# Patient Record
Sex: Female | Born: 1945 | Race: White | Hispanic: No | Marital: Married | Smoking: Former smoker
Health system: Southern US, Community
[De-identification: ages and names within clinical notes are randomized; demographics above are authoritative.]

## PROBLEM LIST (undated history)

## (undated) DIAGNOSIS — F419 Anxiety disorder, unspecified: Secondary | ICD-10-CM

## (undated) DIAGNOSIS — R159 Full incontinence of feces: Secondary | ICD-10-CM

## (undated) DIAGNOSIS — E785 Hyperlipidemia, unspecified: Secondary | ICD-10-CM

## (undated) DIAGNOSIS — C4490 Unspecified malignant neoplasm of skin, unspecified: Secondary | ICD-10-CM

## (undated) DIAGNOSIS — J45909 Unspecified asthma, uncomplicated: Secondary | ICD-10-CM

## (undated) DIAGNOSIS — M549 Dorsalgia, unspecified: Secondary | ICD-10-CM

## (undated) DIAGNOSIS — H269 Unspecified cataract: Secondary | ICD-10-CM

## (undated) DIAGNOSIS — K5790 Diverticulosis of intestine, part unspecified, without perforation or abscess without bleeding: Secondary | ICD-10-CM

## (undated) DIAGNOSIS — R143 Flatulence: Secondary | ICD-10-CM

## (undated) DIAGNOSIS — Z860100 Personal history of colon polyps, unspecified: Secondary | ICD-10-CM

## (undated) DIAGNOSIS — I10 Essential (primary) hypertension: Secondary | ICD-10-CM

## (undated) DIAGNOSIS — K589 Irritable bowel syndrome without diarrhea: Secondary | ICD-10-CM

## (undated) DIAGNOSIS — M1990 Unspecified osteoarthritis, unspecified site: Secondary | ICD-10-CM

## (undated) DIAGNOSIS — J302 Other seasonal allergic rhinitis: Secondary | ICD-10-CM

## (undated) DIAGNOSIS — K219 Gastro-esophageal reflux disease without esophagitis: Secondary | ICD-10-CM

## (undated) HISTORY — PX: KNEE SURGERY: SHX244

## (undated) HISTORY — DX: Hyperlipidemia, unspecified: E78.5

## (undated) HISTORY — DX: Flatulence: R14.3

## (undated) HISTORY — DX: Diverticulosis of intestine, part unspecified, without perforation or abscess without bleeding: K57.90

## (undated) HISTORY — DX: Unspecified cataract: H26.9

## (undated) HISTORY — PX: FOOT SURGERY: SHX648

## (undated) HISTORY — PX: CATARACT EXTRACTION, BILATERAL: SHX1313

## (undated) HISTORY — DX: Gastro-esophageal reflux disease without esophagitis: K21.9

## (undated) HISTORY — DX: Full incontinence of feces: R15.9

## (undated) HISTORY — DX: Dorsalgia, unspecified: M54.9

## (undated) HISTORY — DX: Irritable bowel syndrome, unspecified: K58.9

## (undated) HISTORY — DX: Unspecified malignant neoplasm of skin, unspecified: C44.90

## (undated) HISTORY — DX: Personal history of colon polyps, unspecified: Z86.0100

## (undated) HISTORY — DX: Other seasonal allergic rhinitis: J30.2

## (undated) HISTORY — DX: Anxiety disorder, unspecified: F41.9

## (undated) HISTORY — DX: Essential (primary) hypertension: I10

## (undated) HISTORY — PX: BACK SURGERY: SHX140

## (undated) HISTORY — DX: Personal history of colonic polyps: Z86.010

## (undated) HISTORY — DX: Unspecified osteoarthritis, unspecified site: M19.90

## (undated) HISTORY — DX: Unspecified asthma, uncomplicated: J45.909

## (undated) HISTORY — PX: APPENDECTOMY: SHX54

## (undated) HISTORY — PX: ABDOMINAL HYSTERECTOMY: SHX81

---

## 2008-05-10 DIAGNOSIS — C4492 Squamous cell carcinoma of skin, unspecified: Secondary | ICD-10-CM

## 2008-05-10 HISTORY — DX: Squamous cell carcinoma of skin, unspecified: C44.92

## 2009-08-19 ENCOUNTER — Ambulatory Visit (HOSPITAL_COMMUNITY): Admission: RE | Admit: 2009-08-19 | Discharge: 2009-08-20 | Payer: Self-pay | Admitting: Neurosurgery

## 2010-06-06 LAB — SURGICAL PCR SCREEN
MRSA, PCR: NEGATIVE
Staphylococcus aureus: NEGATIVE

## 2010-06-06 LAB — BASIC METABOLIC PANEL
Creatinine, Ser: 1.1 mg/dL (ref 0.4–1.2)
GFR calc Af Amer: >60 mL/min (ref >60)
GFR calc non Af Amer: 50 mL/min — ABNORMAL LOW (ref >60)
Potassium: 3.8 mEq/L (ref 3.5–5.1)
Sodium: 140 mEq/L (ref 135–145)

## 2010-06-06 LAB — CBC
MCHC: 34.3 g/dL (ref 30.0–36.0)
Platelets: 325 10*3/uL (ref 150–400)
RBC: 4.97 MIL/uL (ref 3.87–5.11)
WBC: 6.1 10*3/uL (ref 4.0–10.5)

## 2010-06-06 LAB — DIFFERENTIAL
Basophils Absolute: 0 10*3/uL (ref 0.0–0.1)
Basophils Relative: 1 % (ref 0–1)
Eosinophils Absolute: 0 10*3/uL (ref 0.0–0.7)
Eosinophils Relative: 1 % (ref 0–5)
Lymphocytes Relative: 32 % (ref 12–46)
Monocytes Absolute: 0.5 10*3/uL (ref 0.1–1.0)
Monocytes Relative: 9 % (ref 3–12)
Neutro Abs: 3.6 10*3/uL (ref 1.7–7.7)

## 2013-03-26 DIAGNOSIS — I10 Essential (primary) hypertension: Secondary | ICD-10-CM | POA: Diagnosis not present

## 2013-03-26 DIAGNOSIS — Z1211 Encounter for screening for malignant neoplasm of colon: Secondary | ICD-10-CM | POA: Diagnosis not present

## 2013-04-16 DIAGNOSIS — I119 Hypertensive heart disease without heart failure: Secondary | ICD-10-CM | POA: Diagnosis not present

## 2013-04-16 DIAGNOSIS — D126 Benign neoplasm of colon, unspecified: Secondary | ICD-10-CM | POA: Diagnosis not present

## 2013-04-16 DIAGNOSIS — K5730 Diverticulosis of large intestine without perforation or abscess without bleeding: Secondary | ICD-10-CM | POA: Diagnosis not present

## 2013-04-16 DIAGNOSIS — Z1211 Encounter for screening for malignant neoplasm of colon: Secondary | ICD-10-CM | POA: Diagnosis not present

## 2013-06-16 DIAGNOSIS — H526 Other disorders of refraction: Secondary | ICD-10-CM | POA: Diagnosis not present

## 2013-06-16 DIAGNOSIS — H2510 Age-related nuclear cataract, unspecified eye: Secondary | ICD-10-CM | POA: Diagnosis not present

## 2013-06-16 DIAGNOSIS — H11159 Pinguecula, unspecified eye: Secondary | ICD-10-CM | POA: Diagnosis not present

## 2013-06-16 DIAGNOSIS — H04129 Dry eye syndrome of unspecified lacrimal gland: Secondary | ICD-10-CM | POA: Diagnosis not present

## 2013-06-23 DIAGNOSIS — E785 Hyperlipidemia, unspecified: Secondary | ICD-10-CM | POA: Diagnosis not present

## 2013-06-23 DIAGNOSIS — R7301 Impaired fasting glucose: Secondary | ICD-10-CM | POA: Diagnosis not present

## 2013-06-30 DIAGNOSIS — N182 Chronic kidney disease, stage 2 (mild): Secondary | ICD-10-CM | POA: Diagnosis not present

## 2013-06-30 DIAGNOSIS — Z23 Encounter for immunization: Secondary | ICD-10-CM | POA: Diagnosis not present

## 2013-06-30 DIAGNOSIS — Z0000 Encounter for general adult medical examination without abnormal findings: Secondary | ICD-10-CM | POA: Diagnosis not present

## 2013-07-03 DIAGNOSIS — Z1231 Encounter for screening mammogram for malignant neoplasm of breast: Secondary | ICD-10-CM | POA: Diagnosis not present

## 2013-07-07 DIAGNOSIS — L570 Actinic keratosis: Secondary | ICD-10-CM | POA: Diagnosis not present

## 2013-07-07 DIAGNOSIS — D235 Other benign neoplasm of skin of trunk: Secondary | ICD-10-CM | POA: Diagnosis not present

## 2013-07-07 DIAGNOSIS — L819 Disorder of pigmentation, unspecified: Secondary | ICD-10-CM | POA: Diagnosis not present

## 2013-07-07 DIAGNOSIS — L820 Inflamed seborrheic keratosis: Secondary | ICD-10-CM | POA: Diagnosis not present

## 2013-11-03 ENCOUNTER — Ambulatory Visit (INDEPENDENT_AMBULATORY_CARE_PROVIDER_SITE_OTHER): Payer: Medicare Other

## 2013-11-03 ENCOUNTER — Encounter: Payer: Self-pay | Admitting: Podiatry

## 2013-11-03 ENCOUNTER — Ambulatory Visit (INDEPENDENT_AMBULATORY_CARE_PROVIDER_SITE_OTHER): Payer: Medicare Other | Admitting: Podiatry

## 2013-11-03 VITALS — BP 126/77 | HR 63 | Resp 16 | Ht 62.0 in | Wt 131.0 lb

## 2013-11-03 DIAGNOSIS — M779 Enthesopathy, unspecified: Secondary | ICD-10-CM | POA: Diagnosis not present

## 2013-11-03 DIAGNOSIS — M7750 Other enthesopathy of unspecified foot: Secondary | ICD-10-CM

## 2013-11-03 DIAGNOSIS — M7672 Peroneal tendinitis, left leg: Secondary | ICD-10-CM

## 2013-11-03 NOTE — Progress Notes (Signed)
   Subjective:    Patient ID: Ashley Brennan, female    DOB: 04-18-1945, 68 y.o.   MRN: [id number]  HPI Comments: My left foot on the lateral side hurts. Its been hurting for 2 months. Its about the same, sometimes the pain is worse. Exercising bothers it. i cant walk bare foot. The bed sheets hurt it. i see a podiatrist and he gives me injections. i had my last injection 2 yrs ago. i wear good shoes, stopped my exercising, ice.  Foot Pain      Review of Systems  Eyes: Positive for itching.  Musculoskeletal:       Joint pain Difficulty walking   Allergic/Immunologic: Positive for environmental allergies.  All other systems reviewed and are negative.      Objective:   Physical Exam: I have reviewed her past medical history medications allergies surgeries social history and review of systems. Pulses are strongly palpable bilateral. Neurologic sensorium is intact per since once the monofilament. Deep tendon reflexes are intact bilateral muscle strength is 5 over 5 dorsiflexors plantar flexors inverters everters all intrinsic musculature is intact. She has pain on palpation fifth metatarsal base of the left foot with no pain on palpation of the peroneal tendons.  Assessment: Insertional peroneal tendinitis left foot.  Plan: Injected dexamethasone and Kenalog today with local anesthetic fifth metatarsal base left.        Assessment & Plan:

## 2014-01-20 DIAGNOSIS — Z23 Encounter for immunization: Secondary | ICD-10-CM | POA: Diagnosis not present

## 2014-02-02 DIAGNOSIS — H04123 Dry eye syndrome of bilateral lacrimal glands: Secondary | ICD-10-CM | POA: Diagnosis not present

## 2014-10-19 DIAGNOSIS — M180 Bilateral primary osteoarthritis of first carpometacarpal joints: Secondary | ICD-10-CM | POA: Diagnosis not present

## 2014-11-05 ENCOUNTER — Encounter: Payer: Self-pay | Admitting: Podiatry

## 2014-11-05 ENCOUNTER — Ambulatory Visit (INDEPENDENT_AMBULATORY_CARE_PROVIDER_SITE_OTHER): Payer: Medicare Other | Admitting: Podiatry

## 2014-11-05 ENCOUNTER — Ambulatory Visit (INDEPENDENT_AMBULATORY_CARE_PROVIDER_SITE_OTHER): Payer: Medicare Other

## 2014-11-05 DIAGNOSIS — M205X1 Other deformities of toe(s) (acquired), right foot: Secondary | ICD-10-CM

## 2014-11-05 DIAGNOSIS — M778 Other enthesopathies, not elsewhere classified: Secondary | ICD-10-CM

## 2014-11-05 DIAGNOSIS — M7751 Other enthesopathy of right foot: Secondary | ICD-10-CM | POA: Diagnosis not present

## 2014-11-05 DIAGNOSIS — M779 Enthesopathy, unspecified: Secondary | ICD-10-CM | POA: Diagnosis not present

## 2014-11-05 MED ORDER — DICLOFENAC SODIUM 1 % TD GEL: 4.0000 g | Freq: Four times a day (QID) | TRANSDERMAL | Status: DC

## 2014-11-05 NOTE — Progress Notes (Signed)
   Subjective:    Patient ID: Ashley Brennan, female    DOB: 07/20/1945, 69 y.o.   MRN: [id number]  HPI : She presents today with chief complaint of a painful first metatarsophalangeal joint of the right foot. She states this been bothering her for quite some time. She states that I'm sure that his arthritis just like in my hands in my hands injected multiple times.    Review of Systems  Musculoskeletal: Positive for gait problem.       Objective:   Physical Exam : I have reviewed her past medical history medications allergies surgery social history and surgical history. I reviewed her chief complaint and statement. She is in no acute distress and quite jovial.  Vital signs are stable she is alert and oriented 3. Pulses are strongly palpable bilateral. Neurologic sensorium is intact with Derrel Nip monofilament. Deep tendon reflexes are intact bilateral. Muscle strength +5 over 5 dorsiflexion plantarflexion  Inverters and evertors all intrinsic musculature is intact. Orthopedic evaluation all joints distal to the ankle have full range of motion without crepitation.  She has pain on end range of motion of the first metatarsophalangeal joint. She has pain on distraction of the first metatarsophalangeal joint and dorsal spurring is noted.  Radiographs taken today 3 views right foot demonstrate normal osseous architecture with exception of some osteoarthritic changes first metatarsophalangeal joint of the right foot assistants consistent with joint space narrowing subchondral sclerosis. Cutaneous evaluationsupple well-hydrated acute is no erythema and edema stabilized drainage.          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  Plan: discussed etiology pathology conservative versus surgical therapies. At this point I offered her a prescription for diclofenac gel to be applied 2-4 times a day. I also injected the first  metatarsophalangeal joint with dexamethasone and local and aesthetic today after a sterile Betadine skin prep. We discussed appropriate shoe gear stretching exercises ice therapy and she'll modifications. I will follow-up with her in the near future.    Roselind Messier DPM

## 2014-11-17 ENCOUNTER — Other Ambulatory Visit: Payer: Self-pay | Admitting: Family Medicine

## 2014-11-17 DIAGNOSIS — Z0000 Encounter for general adult medical examination without abnormal findings: Secondary | ICD-10-CM | POA: Diagnosis not present

## 2014-11-17 DIAGNOSIS — E785 Hyperlipidemia, unspecified: Secondary | ICD-10-CM | POA: Diagnosis not present

## 2014-11-17 DIAGNOSIS — Z1239 Encounter for other screening for malignant neoplasm of breast: Secondary | ICD-10-CM | POA: Diagnosis not present

## 2014-11-17 DIAGNOSIS — I10 Essential (primary) hypertension: Secondary | ICD-10-CM | POA: Diagnosis not present

## 2014-11-17 DIAGNOSIS — R1011 Right upper quadrant pain: Secondary | ICD-10-CM | POA: Diagnosis not present

## 2014-11-17 DIAGNOSIS — R7301 Impaired fasting glucose: Secondary | ICD-10-CM | POA: Diagnosis not present

## 2014-11-25 ENCOUNTER — Ambulatory Visit
Admission: RE | Admit: 2014-11-25 | Discharge: 2014-11-25 | Disposition: A | Discharge status: Home / Self Care | Payer: Medicare Other | Source: Ambulatory Visit | Attending: Family Medicine | Admitting: Family Medicine

## 2014-11-25 DIAGNOSIS — R1011 Right upper quadrant pain: Secondary | ICD-10-CM | POA: Diagnosis not present

## 2014-11-26 ENCOUNTER — Other Ambulatory Visit: Payer: Self-pay | Admitting: Family Medicine

## 2014-11-26 DIAGNOSIS — Z1231 Encounter for screening mammogram for malignant neoplasm of breast: Secondary | ICD-10-CM

## 2014-12-08 ENCOUNTER — Ambulatory Visit
Admission: RE | Admit: 2014-12-08 | Discharge: 2014-12-08 | Disposition: A | Discharge status: Home / Self Care | Payer: Medicare Other | Source: Ambulatory Visit | Attending: Family Medicine | Admitting: Family Medicine

## 2014-12-08 DIAGNOSIS — Z1231 Encounter for screening mammogram for malignant neoplasm of breast: Secondary | ICD-10-CM

## 2014-12-22 DIAGNOSIS — L814 Other melanin hyperpigmentation: Secondary | ICD-10-CM | POA: Diagnosis not present

## 2014-12-22 DIAGNOSIS — D485 Neoplasm of uncertain behavior of skin: Secondary | ICD-10-CM | POA: Diagnosis not present

## 2014-12-22 DIAGNOSIS — D239 Other benign neoplasm of skin, unspecified: Secondary | ICD-10-CM | POA: Diagnosis not present

## 2014-12-22 DIAGNOSIS — L570 Actinic keratosis: Secondary | ICD-10-CM | POA: Diagnosis not present

## 2014-12-30 DIAGNOSIS — Z23 Encounter for immunization: Secondary | ICD-10-CM | POA: Diagnosis not present

## 2015-01-14 DIAGNOSIS — M5416 Radiculopathy, lumbar region: Secondary | ICD-10-CM | POA: Diagnosis not present

## 2015-01-14 DIAGNOSIS — Z6825 Body mass index (BMI) 25.0-25.9, adult: Secondary | ICD-10-CM | POA: Diagnosis not present

## 2015-01-14 DIAGNOSIS — I10 Essential (primary) hypertension: Secondary | ICD-10-CM | POA: Diagnosis not present

## 2015-01-14 DIAGNOSIS — M5412 Radiculopathy, cervical region: Secondary | ICD-10-CM | POA: Insufficient documentation

## 2015-01-15 DIAGNOSIS — M5416 Radiculopathy, lumbar region: Secondary | ICD-10-CM | POA: Diagnosis not present

## 2015-01-20 DIAGNOSIS — M5412 Radiculopathy, cervical region: Secondary | ICD-10-CM | POA: Diagnosis not present

## 2015-01-20 DIAGNOSIS — M47816 Spondylosis without myelopathy or radiculopathy, lumbar region: Secondary | ICD-10-CM | POA: Diagnosis not present

## 2015-01-20 DIAGNOSIS — M5416 Radiculopathy, lumbar region: Secondary | ICD-10-CM | POA: Diagnosis not present

## 2015-01-20 DIAGNOSIS — M542 Cervicalgia: Secondary | ICD-10-CM | POA: Diagnosis not present

## 2015-01-28 DIAGNOSIS — M5416 Radiculopathy, lumbar region: Secondary | ICD-10-CM | POA: Diagnosis not present

## 2015-03-01 DIAGNOSIS — H04123 Dry eye syndrome of bilateral lacrimal glands: Secondary | ICD-10-CM | POA: Diagnosis not present

## 2015-03-01 DIAGNOSIS — H25813 Combined forms of age-related cataract, bilateral: Secondary | ICD-10-CM | POA: Diagnosis not present

## 2015-04-08 DIAGNOSIS — I10 Essential (primary) hypertension: Secondary | ICD-10-CM | POA: Diagnosis not present

## 2015-04-12 DIAGNOSIS — M5432 Sciatica, left side: Secondary | ICD-10-CM | POA: Diagnosis not present

## 2015-04-12 DIAGNOSIS — M5410 Radiculopathy, site unspecified: Secondary | ICD-10-CM | POA: Diagnosis not present

## 2015-04-12 DIAGNOSIS — M25552 Pain in left hip: Secondary | ICD-10-CM | POA: Diagnosis not present

## 2015-04-13 DIAGNOSIS — M25552 Pain in left hip: Secondary | ICD-10-CM | POA: Diagnosis not present

## 2015-04-13 DIAGNOSIS — M5410 Radiculopathy, site unspecified: Secondary | ICD-10-CM | POA: Diagnosis not present

## 2015-04-13 DIAGNOSIS — M5432 Sciatica, left side: Secondary | ICD-10-CM | POA: Diagnosis not present

## 2015-04-14 DIAGNOSIS — M25552 Pain in left hip: Secondary | ICD-10-CM | POA: Diagnosis not present

## 2015-04-14 DIAGNOSIS — E785 Hyperlipidemia, unspecified: Secondary | ICD-10-CM | POA: Diagnosis not present

## 2015-04-14 DIAGNOSIS — I10 Essential (primary) hypertension: Secondary | ICD-10-CM | POA: Diagnosis not present

## 2015-04-20 DIAGNOSIS — M25552 Pain in left hip: Secondary | ICD-10-CM | POA: Diagnosis not present

## 2015-04-20 DIAGNOSIS — H25813 Combined forms of age-related cataract, bilateral: Secondary | ICD-10-CM | POA: Diagnosis not present

## 2015-04-20 DIAGNOSIS — Z01818 Encounter for other preprocedural examination: Secondary | ICD-10-CM | POA: Diagnosis not present

## 2015-04-20 DIAGNOSIS — M5432 Sciatica, left side: Secondary | ICD-10-CM | POA: Diagnosis not present

## 2015-04-20 DIAGNOSIS — M5410 Radiculopathy, site unspecified: Secondary | ICD-10-CM | POA: Diagnosis not present

## 2015-04-22 DIAGNOSIS — I10 Essential (primary) hypertension: Secondary | ICD-10-CM | POA: Diagnosis not present

## 2015-04-22 DIAGNOSIS — M25552 Pain in left hip: Secondary | ICD-10-CM | POA: Diagnosis not present

## 2015-04-22 DIAGNOSIS — M5410 Radiculopathy, site unspecified: Secondary | ICD-10-CM | POA: Diagnosis not present

## 2015-04-22 DIAGNOSIS — M5432 Sciatica, left side: Secondary | ICD-10-CM | POA: Diagnosis not present

## 2015-04-22 DIAGNOSIS — E785 Hyperlipidemia, unspecified: Secondary | ICD-10-CM | POA: Diagnosis not present

## 2015-04-23 DIAGNOSIS — I10 Essential (primary) hypertension: Secondary | ICD-10-CM | POA: Diagnosis not present

## 2015-04-23 DIAGNOSIS — M25552 Pain in left hip: Secondary | ICD-10-CM | POA: Diagnosis not present

## 2015-04-26 DIAGNOSIS — H25811 Combined forms of age-related cataract, right eye: Secondary | ICD-10-CM | POA: Diagnosis not present

## 2015-05-03 DIAGNOSIS — M5410 Radiculopathy, site unspecified: Secondary | ICD-10-CM | POA: Diagnosis not present

## 2015-05-03 DIAGNOSIS — M5432 Sciatica, left side: Secondary | ICD-10-CM | POA: Diagnosis not present

## 2015-05-03 DIAGNOSIS — M25552 Pain in left hip: Secondary | ICD-10-CM | POA: Diagnosis not present

## 2015-05-04 DIAGNOSIS — H25811 Combined forms of age-related cataract, right eye: Secondary | ICD-10-CM | POA: Diagnosis not present

## 2015-05-05 DIAGNOSIS — Z961 Presence of intraocular lens: Secondary | ICD-10-CM | POA: Insufficient documentation

## 2015-05-06 DIAGNOSIS — M25552 Pain in left hip: Secondary | ICD-10-CM | POA: Diagnosis not present

## 2015-05-06 DIAGNOSIS — M5432 Sciatica, left side: Secondary | ICD-10-CM | POA: Diagnosis not present

## 2015-05-06 DIAGNOSIS — M5410 Radiculopathy, site unspecified: Secondary | ICD-10-CM | POA: Diagnosis not present

## 2015-05-10 DIAGNOSIS — M25552 Pain in left hip: Secondary | ICD-10-CM | POA: Diagnosis not present

## 2015-05-10 DIAGNOSIS — M5410 Radiculopathy, site unspecified: Secondary | ICD-10-CM | POA: Diagnosis not present

## 2015-05-10 DIAGNOSIS — M5432 Sciatica, left side: Secondary | ICD-10-CM | POA: Diagnosis not present

## 2015-05-14 DIAGNOSIS — M5432 Sciatica, left side: Secondary | ICD-10-CM | POA: Diagnosis not present

## 2015-05-14 DIAGNOSIS — M25552 Pain in left hip: Secondary | ICD-10-CM | POA: Diagnosis not present

## 2015-05-14 DIAGNOSIS — M5410 Radiculopathy, site unspecified: Secondary | ICD-10-CM | POA: Diagnosis not present

## 2015-05-17 DIAGNOSIS — M5432 Sciatica, left side: Secondary | ICD-10-CM | POA: Diagnosis not present

## 2015-05-17 DIAGNOSIS — M5410 Radiculopathy, site unspecified: Secondary | ICD-10-CM | POA: Diagnosis not present

## 2015-05-17 DIAGNOSIS — M25552 Pain in left hip: Secondary | ICD-10-CM | POA: Diagnosis not present

## 2015-05-18 DIAGNOSIS — H25812 Combined forms of age-related cataract, left eye: Secondary | ICD-10-CM | POA: Diagnosis not present

## 2015-05-20 DIAGNOSIS — M5410 Radiculopathy, site unspecified: Secondary | ICD-10-CM | POA: Diagnosis not present

## 2015-05-20 DIAGNOSIS — M25552 Pain in left hip: Secondary | ICD-10-CM | POA: Diagnosis not present

## 2015-05-20 DIAGNOSIS — M5432 Sciatica, left side: Secondary | ICD-10-CM | POA: Diagnosis not present

## 2015-05-24 DIAGNOSIS — Z961 Presence of intraocular lens: Secondary | ICD-10-CM | POA: Diagnosis not present

## 2015-06-15 DIAGNOSIS — J019 Acute sinusitis, unspecified: Secondary | ICD-10-CM | POA: Diagnosis not present

## 2015-07-05 DIAGNOSIS — D485 Neoplasm of uncertain behavior of skin: Secondary | ICD-10-CM | POA: Diagnosis not present

## 2015-07-05 DIAGNOSIS — L820 Inflamed seborrheic keratosis: Secondary | ICD-10-CM | POA: Diagnosis not present

## 2015-11-24 DIAGNOSIS — E785 Hyperlipidemia, unspecified: Secondary | ICD-10-CM | POA: Diagnosis not present

## 2015-11-24 DIAGNOSIS — Z79899 Other long term (current) drug therapy: Secondary | ICD-10-CM | POA: Diagnosis not present

## 2015-11-24 DIAGNOSIS — I10 Essential (primary) hypertension: Secondary | ICD-10-CM | POA: Diagnosis not present

## 2015-11-26 DIAGNOSIS — E785 Hyperlipidemia, unspecified: Secondary | ICD-10-CM | POA: Diagnosis not present

## 2015-11-26 DIAGNOSIS — I10 Essential (primary) hypertension: Secondary | ICD-10-CM | POA: Diagnosis not present

## 2015-11-26 DIAGNOSIS — Z0000 Encounter for general adult medical examination without abnormal findings: Secondary | ICD-10-CM | POA: Diagnosis not present

## 2015-11-26 DIAGNOSIS — Z23 Encounter for immunization: Secondary | ICD-10-CM | POA: Diagnosis not present

## 2016-02-14 ENCOUNTER — Other Ambulatory Visit: Payer: Self-pay | Admitting: Family

## 2016-02-14 DIAGNOSIS — E2839 Other primary ovarian failure: Secondary | ICD-10-CM

## 2016-02-14 DIAGNOSIS — Z1231 Encounter for screening mammogram for malignant neoplasm of breast: Secondary | ICD-10-CM

## 2016-03-07 ENCOUNTER — Other Ambulatory Visit: Payer: Self-pay | Admitting: Physician Assistant

## 2016-03-07 DIAGNOSIS — M1711 Unilateral primary osteoarthritis, right knee: Secondary | ICD-10-CM | POA: Diagnosis not present

## 2016-03-07 DIAGNOSIS — M25511 Pain in right shoulder: Secondary | ICD-10-CM | POA: Diagnosis not present

## 2016-03-08 ENCOUNTER — Other Ambulatory Visit: Payer: Self-pay | Admitting: Physician Assistant

## 2016-03-08 DIAGNOSIS — M25511 Pain in right shoulder: Secondary | ICD-10-CM

## 2016-03-16 ENCOUNTER — Ambulatory Visit
Admission: RE | Admit: 2016-03-16 | Discharge: 2016-03-16 | Disposition: A | Discharge status: Home / Self Care | Payer: Medicare Other | Source: Ambulatory Visit | Attending: Physician Assistant | Admitting: Physician Assistant

## 2016-03-16 ENCOUNTER — Other Ambulatory Visit: Payer: Self-pay | Admitting: Physician Assistant

## 2016-03-16 DIAGNOSIS — R072 Precordial pain: Secondary | ICD-10-CM | POA: Diagnosis not present

## 2016-03-16 DIAGNOSIS — M25511 Pain in right shoulder: Secondary | ICD-10-CM

## 2016-03-24 ENCOUNTER — Ambulatory Visit: Payer: Medicare Other

## 2016-03-24 ENCOUNTER — Inpatient Hospital Stay: Admission: RE | Admit: 2016-03-24 | Payer: Medicare Other | Source: Ambulatory Visit

## 2016-03-29 DIAGNOSIS — M19011 Primary osteoarthritis, right shoulder: Secondary | ICD-10-CM | POA: Diagnosis not present

## 2016-05-10 DIAGNOSIS — D229 Melanocytic nevi, unspecified: Secondary | ICD-10-CM | POA: Diagnosis not present

## 2016-05-10 DIAGNOSIS — D044 Carcinoma in situ of skin of scalp and neck: Secondary | ICD-10-CM | POA: Diagnosis not present

## 2016-05-10 DIAGNOSIS — Z7189 Other specified counseling: Secondary | ICD-10-CM | POA: Diagnosis not present

## 2016-05-15 DIAGNOSIS — H16223 Keratoconjunctivitis sicca, not specified as Sjogren's, bilateral: Secondary | ICD-10-CM | POA: Diagnosis not present

## 2016-05-15 DIAGNOSIS — H35371 Puckering of macula, right eye: Secondary | ICD-10-CM | POA: Diagnosis not present

## 2016-05-15 DIAGNOSIS — Z961 Presence of intraocular lens: Secondary | ICD-10-CM | POA: Diagnosis not present

## 2016-05-15 DIAGNOSIS — H26493 Other secondary cataract, bilateral: Secondary | ICD-10-CM | POA: Diagnosis not present

## 2016-06-22 DIAGNOSIS — L570 Actinic keratosis: Secondary | ICD-10-CM | POA: Diagnosis not present

## 2016-06-22 DIAGNOSIS — D044 Carcinoma in situ of skin of scalp and neck: Secondary | ICD-10-CM | POA: Diagnosis not present

## 2016-07-07 ENCOUNTER — Ambulatory Visit
Admission: RE | Admit: 2016-07-07 | Discharge: 2016-07-07 | Disposition: A | Discharge status: Home / Self Care | Payer: Medicare Other | Source: Ambulatory Visit | Attending: Family | Admitting: Family

## 2016-07-07 ENCOUNTER — Encounter: Payer: Self-pay | Admitting: Radiology

## 2016-07-07 DIAGNOSIS — Z1231 Encounter for screening mammogram for malignant neoplasm of breast: Secondary | ICD-10-CM | POA: Diagnosis not present

## 2016-08-09 DIAGNOSIS — M5136 Other intervertebral disc degeneration, lumbar region: Secondary | ICD-10-CM | POA: Diagnosis not present

## 2016-08-09 DIAGNOSIS — M5030 Other cervical disc degeneration, unspecified cervical region: Secondary | ICD-10-CM | POA: Diagnosis not present

## 2016-08-09 DIAGNOSIS — M542 Cervicalgia: Secondary | ICD-10-CM | POA: Diagnosis not present

## 2016-08-09 DIAGNOSIS — M9903 Segmental and somatic dysfunction of lumbar region: Secondary | ICD-10-CM | POA: Diagnosis not present

## 2016-08-11 DIAGNOSIS — M5030 Other cervical disc degeneration, unspecified cervical region: Secondary | ICD-10-CM | POA: Diagnosis not present

## 2016-08-11 DIAGNOSIS — M5136 Other intervertebral disc degeneration, lumbar region: Secondary | ICD-10-CM | POA: Diagnosis not present

## 2016-08-11 DIAGNOSIS — M542 Cervicalgia: Secondary | ICD-10-CM | POA: Diagnosis not present

## 2016-08-11 DIAGNOSIS — M9903 Segmental and somatic dysfunction of lumbar region: Secondary | ICD-10-CM | POA: Diagnosis not present

## 2016-08-16 DIAGNOSIS — M5136 Other intervertebral disc degeneration, lumbar region: Secondary | ICD-10-CM | POA: Diagnosis not present

## 2016-08-16 DIAGNOSIS — M9903 Segmental and somatic dysfunction of lumbar region: Secondary | ICD-10-CM | POA: Diagnosis not present

## 2016-08-16 DIAGNOSIS — M5030 Other cervical disc degeneration, unspecified cervical region: Secondary | ICD-10-CM | POA: Diagnosis not present

## 2016-08-16 DIAGNOSIS — M542 Cervicalgia: Secondary | ICD-10-CM | POA: Diagnosis not present

## 2016-10-24 DIAGNOSIS — L570 Actinic keratosis: Secondary | ICD-10-CM | POA: Diagnosis not present

## 2016-10-24 DIAGNOSIS — C4492 Squamous cell carcinoma of skin, unspecified: Secondary | ICD-10-CM

## 2016-10-24 DIAGNOSIS — D0461 Carcinoma in situ of skin of right upper limb, including shoulder: Secondary | ICD-10-CM | POA: Diagnosis not present

## 2016-10-24 HISTORY — DX: Squamous cell carcinoma of skin, unspecified: C44.92

## 2016-10-31 DIAGNOSIS — Z1211 Encounter for screening for malignant neoplasm of colon: Secondary | ICD-10-CM | POA: Diagnosis not present

## 2016-10-31 DIAGNOSIS — Z86010 Personal history of colonic polyps: Secondary | ICD-10-CM | POA: Diagnosis not present

## 2016-10-31 DIAGNOSIS — K580 Irritable bowel syndrome with diarrhea: Secondary | ICD-10-CM | POA: Diagnosis not present

## 2016-11-01 DIAGNOSIS — A09 Infectious gastroenteritis and colitis, unspecified: Secondary | ICD-10-CM | POA: Diagnosis not present

## 2016-11-02 DIAGNOSIS — K580 Irritable bowel syndrome with diarrhea: Secondary | ICD-10-CM | POA: Diagnosis not present

## 2016-11-02 DIAGNOSIS — E785 Hyperlipidemia, unspecified: Secondary | ICD-10-CM | POA: Diagnosis not present

## 2016-11-02 DIAGNOSIS — I10 Essential (primary) hypertension: Secondary | ICD-10-CM | POA: Diagnosis not present

## 2016-11-03 DIAGNOSIS — K648 Other hemorrhoids: Secondary | ICD-10-CM | POA: Diagnosis not present

## 2016-11-03 DIAGNOSIS — K52832 Lymphocytic colitis: Secondary | ICD-10-CM | POA: Diagnosis not present

## 2016-11-03 DIAGNOSIS — R197 Diarrhea, unspecified: Secondary | ICD-10-CM | POA: Diagnosis not present

## 2016-11-03 DIAGNOSIS — Z1211 Encounter for screening for malignant neoplasm of colon: Secondary | ICD-10-CM | POA: Diagnosis not present

## 2016-11-03 DIAGNOSIS — Z86010 Personal history of colonic polyps: Secondary | ICD-10-CM | POA: Diagnosis not present

## 2016-11-03 DIAGNOSIS — K6389 Other specified diseases of intestine: Secondary | ICD-10-CM | POA: Diagnosis not present

## 2016-11-03 DIAGNOSIS — K5730 Diverticulosis of large intestine without perforation or abscess without bleeding: Secondary | ICD-10-CM | POA: Diagnosis not present

## 2016-11-03 DIAGNOSIS — Z8719 Personal history of other diseases of the digestive system: Secondary | ICD-10-CM | POA: Diagnosis not present

## 2016-11-03 HISTORY — PX: COLONOSCOPY: SHX174

## 2016-11-24 DIAGNOSIS — H04123 Dry eye syndrome of bilateral lacrimal glands: Secondary | ICD-10-CM | POA: Diagnosis not present

## 2016-11-24 DIAGNOSIS — H43811 Vitreous degeneration, right eye: Secondary | ICD-10-CM | POA: Diagnosis not present

## 2016-11-24 DIAGNOSIS — H35371 Puckering of macula, right eye: Secondary | ICD-10-CM | POA: Diagnosis not present

## 2016-11-28 ENCOUNTER — Other Ambulatory Visit: Payer: Self-pay | Admitting: Gastroenterology

## 2016-11-28 DIAGNOSIS — K52832 Lymphocytic colitis: Secondary | ICD-10-CM

## 2016-11-28 DIAGNOSIS — K580 Irritable bowel syndrome with diarrhea: Secondary | ICD-10-CM

## 2016-12-01 ENCOUNTER — Ambulatory Visit
Admission: RE | Admit: 2016-12-01 | Discharge: 2016-12-01 | Disposition: A | Discharge status: Home / Self Care | Payer: Medicare Other | Source: Ambulatory Visit | Attending: Gastroenterology | Admitting: Gastroenterology

## 2016-12-01 DIAGNOSIS — K52832 Lymphocytic colitis: Secondary | ICD-10-CM

## 2016-12-01 DIAGNOSIS — R197 Diarrhea, unspecified: Secondary | ICD-10-CM | POA: Diagnosis not present

## 2016-12-01 DIAGNOSIS — K580 Irritable bowel syndrome with diarrhea: Secondary | ICD-10-CM

## 2016-12-01 MED ORDER — IOPAMIDOL (ISOVUE-300) INJECTION 61%
100.0000 mL | Freq: Once | INTRAVENOUS | Status: AC | PRN
  Administered 2016-12-01: 100 mL via INTRAVENOUS

## 2016-12-06 DIAGNOSIS — H43811 Vitreous degeneration, right eye: Secondary | ICD-10-CM | POA: Diagnosis not present

## 2016-12-06 DIAGNOSIS — H43391 Other vitreous opacities, right eye: Secondary | ICD-10-CM | POA: Diagnosis not present

## 2016-12-06 DIAGNOSIS — H35371 Puckering of macula, right eye: Secondary | ICD-10-CM | POA: Diagnosis not present

## 2016-12-11 DIAGNOSIS — H43391 Other vitreous opacities, right eye: Secondary | ICD-10-CM | POA: Diagnosis not present

## 2016-12-11 DIAGNOSIS — H43311 Vitreous membranes and strands, right eye: Secondary | ICD-10-CM | POA: Diagnosis not present

## 2016-12-11 DIAGNOSIS — H35371 Puckering of macula, right eye: Secondary | ICD-10-CM | POA: Diagnosis not present

## 2016-12-19 DIAGNOSIS — Z09 Encounter for follow-up examination after completed treatment for conditions other than malignant neoplasm: Secondary | ICD-10-CM | POA: Diagnosis not present

## 2016-12-19 DIAGNOSIS — H35371 Puckering of macula, right eye: Secondary | ICD-10-CM | POA: Diagnosis not present

## 2017-01-03 DIAGNOSIS — Z23 Encounter for immunization: Secondary | ICD-10-CM | POA: Diagnosis not present

## 2017-01-10 DIAGNOSIS — Z09 Encounter for follow-up examination after completed treatment for conditions other than malignant neoplasm: Secondary | ICD-10-CM | POA: Diagnosis not present

## 2017-01-10 DIAGNOSIS — H35371 Puckering of macula, right eye: Secondary | ICD-10-CM | POA: Diagnosis not present

## 2017-01-11 ENCOUNTER — Encounter (INDEPENDENT_AMBULATORY_CARE_PROVIDER_SITE_OTHER): Payer: Medicare Other | Admitting: Podiatry

## 2017-01-11 NOTE — Progress Notes (Signed)
This encounter was created in error - please disregard.

## 2017-01-23 ENCOUNTER — Ambulatory Visit (INDEPENDENT_AMBULATORY_CARE_PROVIDER_SITE_OTHER): Payer: Medicare Other | Admitting: Podiatry

## 2017-01-23 DIAGNOSIS — G5762 Lesion of plantar nerve, left lower limb: Secondary | ICD-10-CM

## 2017-01-23 DIAGNOSIS — M778 Other enthesopathies, not elsewhere classified: Secondary | ICD-10-CM

## 2017-01-23 DIAGNOSIS — M7751 Other enthesopathy of right foot: Secondary | ICD-10-CM

## 2017-01-23 DIAGNOSIS — M779 Enthesopathy, unspecified: Principal | ICD-10-CM

## 2017-01-24 DIAGNOSIS — D492 Neoplasm of unspecified behavior of bone, soft tissue, and skin: Secondary | ICD-10-CM | POA: Diagnosis not present

## 2017-01-24 DIAGNOSIS — L570 Actinic keratosis: Secondary | ICD-10-CM | POA: Diagnosis not present

## 2017-01-24 NOTE — Progress Notes (Signed)
She presents today with chief complaint of a painful first metatarsophalangeal joint. She is also complaining of radiating pain from her third and fourth toes on the left foot.  Objective: Vital signs are stable she is alert and oriented 3. Pulses are palpable. She has pain on palpation and range of motion of the first metatarsophalangeal joint of the right foot as well as pain on palpation with a palpable Mulder's click to the third interdigital space of the left foot.  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  Plan: Discussed etiology pathology conservative versus surgical therapies. I injected the first metatarsophalangeal joint today after sterile Betadine skin prep with Kenalog and local anesthetic. I also injected Kenalog and local anesthetic to the third interdigital space of the left foot today. She tolerated procedure well follow up with her in 1 month if necessary.

## 2017-01-29 IMAGING — MG MM SCREENING BREAST TOMO BILATERAL
8 series · 8 of 24 positions shown · non-contrast
Comparison: Previous exam(s).

CLINICAL DATA: Screening.

EXAM:
DIGITAL SCREENING BILATERAL MAMMOGRAM WITH 3D TOMO WITH CAD

[R CC]
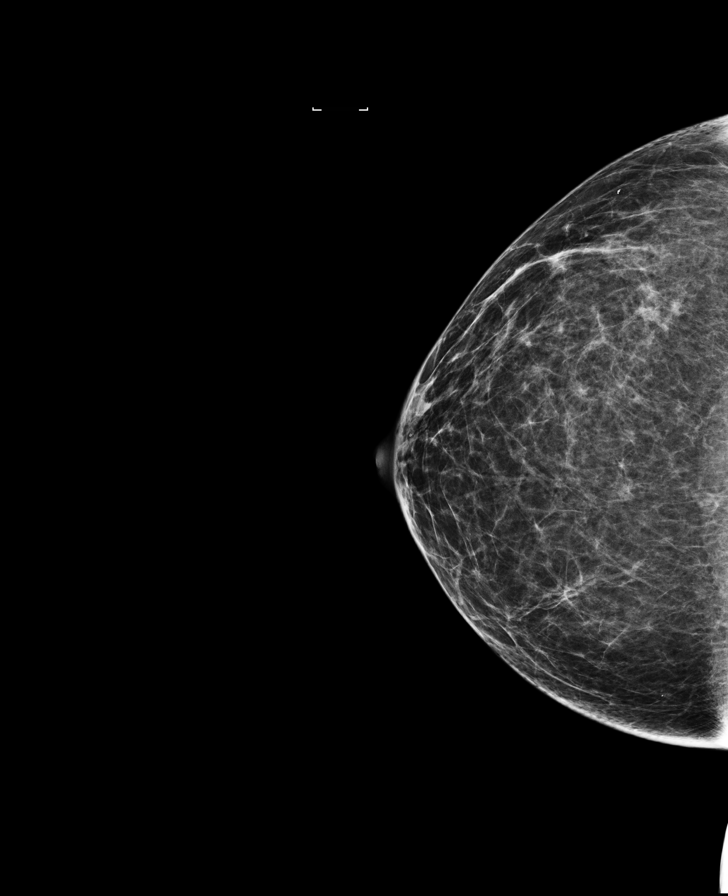

[L MLO]
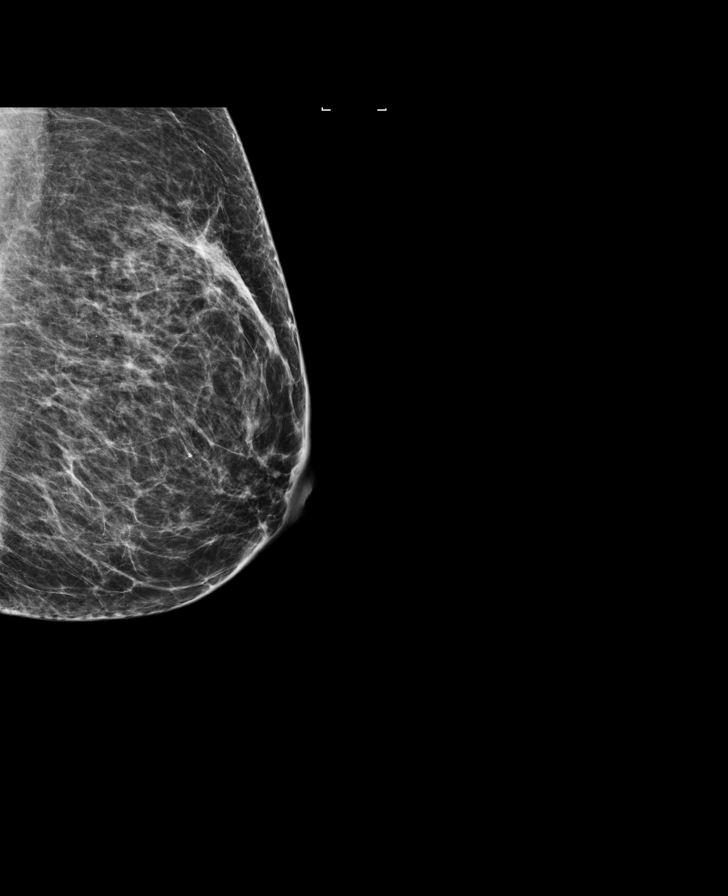

[L CC]
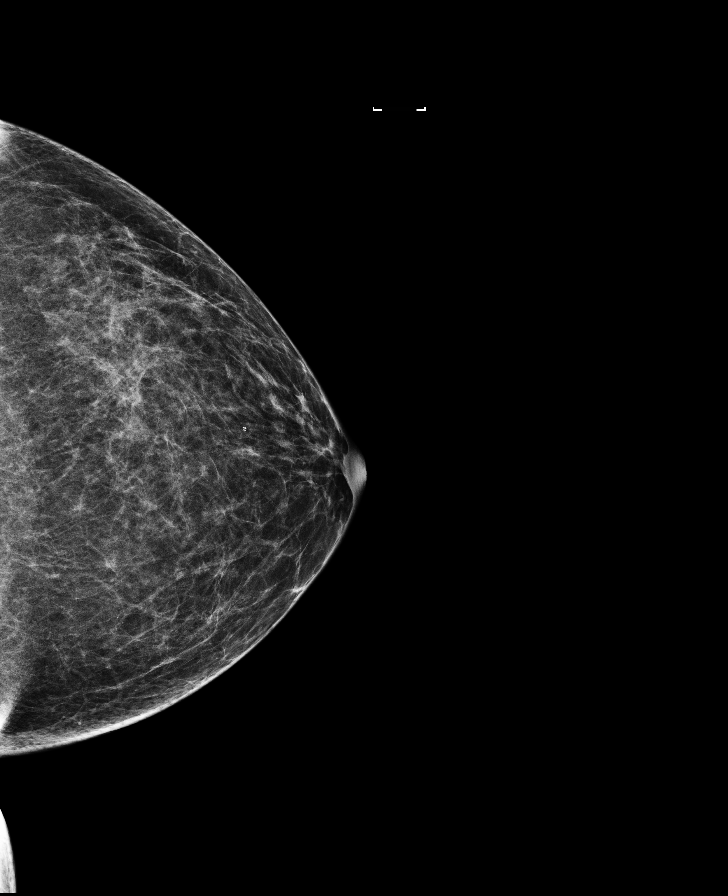

[R MLO]
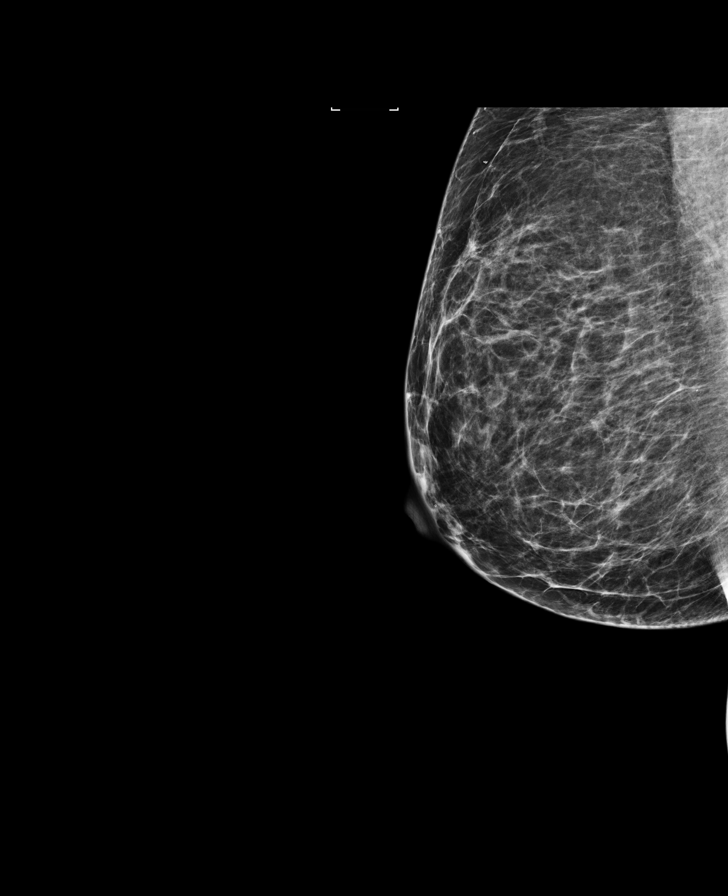

[L CC tomo · tomo slice 33/64.0]
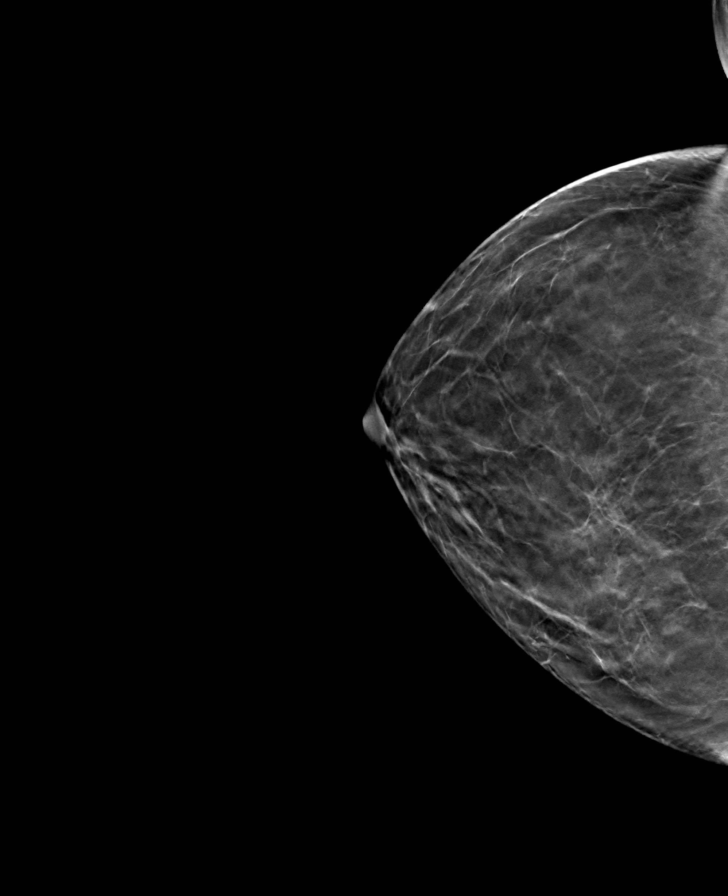

[L MLO tomo · tomo slice 35/70.0]
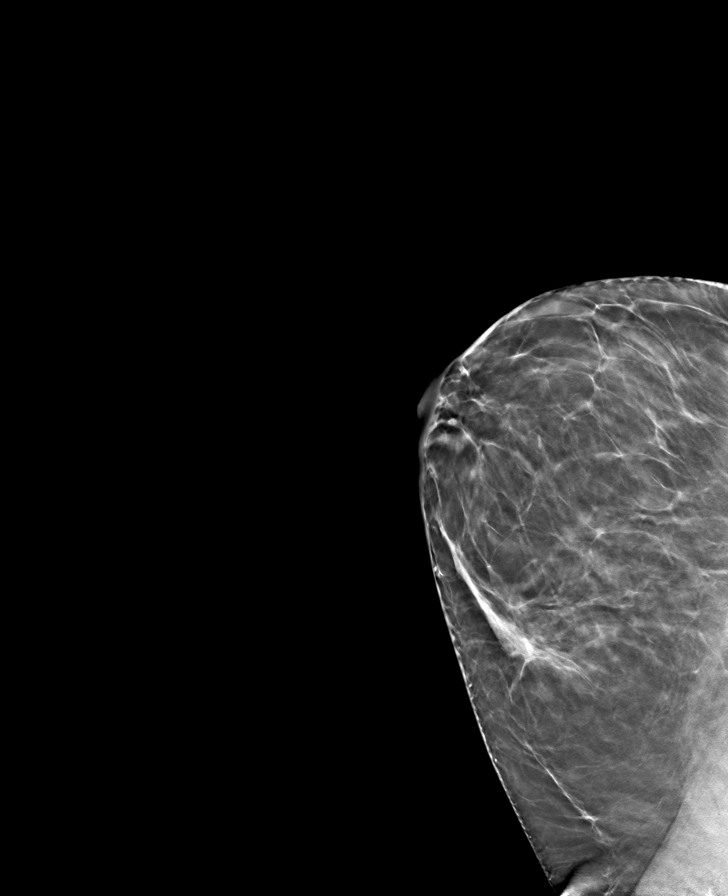

[R CC tomo · tomo slice 33/65.0]
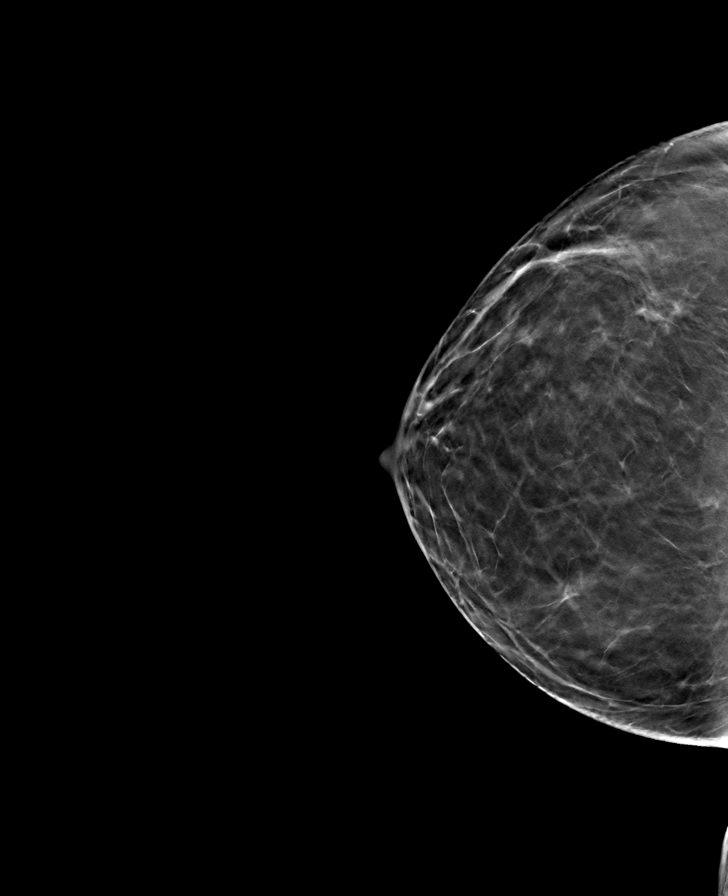

[R MLO tomo · tomo slice 35/69.0]
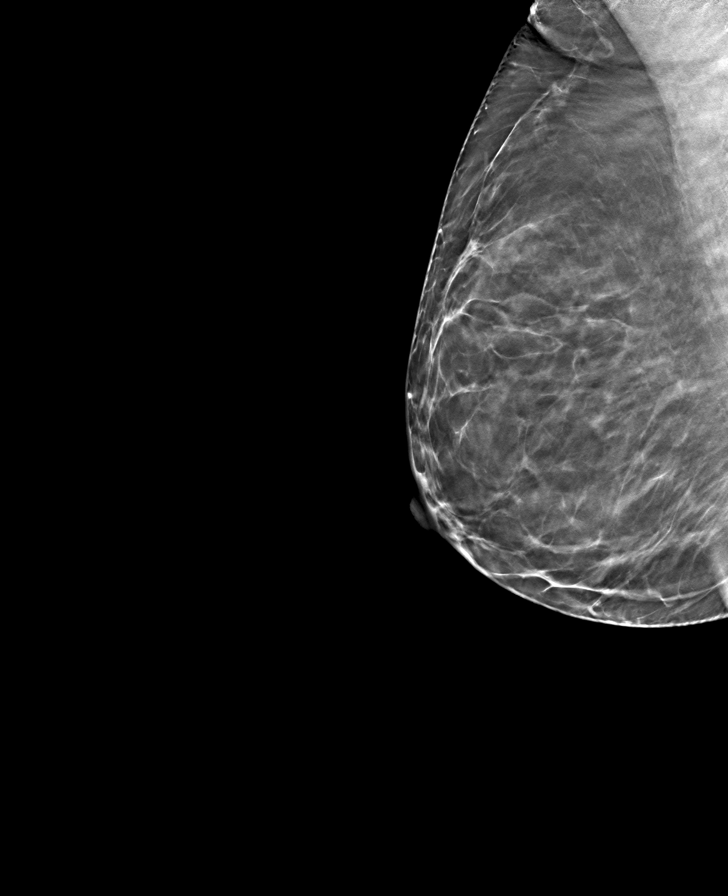

[8 of 24 positions shown; findings below may reference images not displayed]

ACR Breast Density Category c: The breast tissue is heterogeneously
dense, which may obscure small masses.
FINDINGS: There are no findings suspicious for malignancy. Images were
processed with CAD.
IMPRESSION: No mammographic evidence of malignancy. A result letter of this
screening mammogram will be mailed directly to the patient.

RECOMMENDATION:
Screening mammogram in one year. (Code:OA-G-1SS)

BI-RADS CATEGORY  1: Negative.

## 2017-02-28 DIAGNOSIS — Z780 Asymptomatic menopausal state: Secondary | ICD-10-CM | POA: Diagnosis not present

## 2017-02-28 DIAGNOSIS — Z13820 Encounter for screening for osteoporosis: Secondary | ICD-10-CM | POA: Diagnosis not present

## 2017-02-28 DIAGNOSIS — I10 Essential (primary) hypertension: Secondary | ICD-10-CM | POA: Diagnosis not present

## 2017-02-28 DIAGNOSIS — M8588 Other specified disorders of bone density and structure, other site: Secondary | ICD-10-CM | POA: Diagnosis not present

## 2017-02-28 DIAGNOSIS — E785 Hyperlipidemia, unspecified: Secondary | ICD-10-CM | POA: Diagnosis not present

## 2017-03-08 ENCOUNTER — Encounter: Payer: Self-pay | Admitting: Podiatry

## 2017-03-08 ENCOUNTER — Ambulatory Visit (INDEPENDENT_AMBULATORY_CARE_PROVIDER_SITE_OTHER): Payer: Medicare Other | Admitting: Podiatry

## 2017-03-08 DIAGNOSIS — G5762 Lesion of plantar nerve, left lower limb: Secondary | ICD-10-CM | POA: Diagnosis not present

## 2017-03-08 NOTE — Progress Notes (Signed)
She presents today for follow-up of her neuroma to the third interdigital space of the left foot.  She states that it is still very tender for me.  She is also complaining of some pain to the posterior aspect of her right heel which she suddenly woke up with just the other morning and which has subsided considerably.  Objective: Vital signs are stable she is alert and oriented x3.  Pulses are palpable.  She has a palpable Mulder's click to the third interdigital space of the left foot with moderate tenderness on palpation of the posterior lateral distal aspect of the Achilles on the right heel.  Minimal plantar fascial type pain.  No calf pain either heel or leg.  Assessment: Neuroma third interdigital space left foot.  Plan: Reinjected her left third interdigital space today with 10 mg of Kenalog 5 mg of Marcaine point of maximal tenderness third interdigital space after sterile Betadine skin prep tolerated procedure well no complications.  Follow-up with me in the next month or so.  She will notify me of her posterior heel pain becomes worse.

## 2017-06-20 ENCOUNTER — Telehealth: Payer: Self-pay | Admitting: Gastroenterology

## 2017-06-20 NOTE — Telephone Encounter (Signed)
Pt needs refill for dicyclomine sent to [facility] order, 90 day supply.

## 2017-06-21 ENCOUNTER — Encounter: Payer: Self-pay | Admitting: Podiatry

## 2017-06-21 ENCOUNTER — Ambulatory Visit (INDEPENDENT_AMBULATORY_CARE_PROVIDER_SITE_OTHER): Payer: Medicare Other | Admitting: Podiatry

## 2017-06-21 DIAGNOSIS — M778 Other enthesopathies, not elsewhere classified: Secondary | ICD-10-CM

## 2017-06-21 DIAGNOSIS — G5762 Lesion of plantar nerve, left lower limb: Secondary | ICD-10-CM

## 2017-06-21 DIAGNOSIS — M779 Enthesopathy, unspecified: Secondary | ICD-10-CM

## 2017-06-21 NOTE — Telephone Encounter (Signed)
Patient wants refill on Dicyclomine, would you like to give her refills? If so how many and what directions?

## 2017-06-21 NOTE — Progress Notes (Signed)
She presents today chief complaint of pain to the first metatarsophalangeal joint of the right foot and numbness between her third and fourth digits of the left foot.  Objective: Vital signs are stable alert and oriented x3.  Pulses are palpable.  Neurologic sensorium is intact.  Deep tendon reflexes are intact.  Muscle strength is 5/5 there is a flexion plantar flexors inverters everters onto the musculature is intact.  She has pain on end range of motion of the first metatarsophalangeal joint of the right foot.  Palpable Mulder's click to the third interdigital space of the left foot.  Assessment: Neuroma third interdigital space left foot.  Capsulitis first metatarsophalangeal joint right foot.  Plan: Discussed etiology pathology conservative or surgical therapies at this point, sterile Betadine skin prep injected dexamethasone third interdigital space of the left foot 2 mg of utilized.  Right foot 20 mg Kenalog 5 mg Marcaine through a 30-gauge needle injected she tolerated procedure well without complications.  Follow-up with her on an as-needed basis.

## 2017-06-21 NOTE — Telephone Encounter (Signed)
Pl fill dicyclomine 10mg  po bid for 90 days 3 refills

## 2017-06-22 MED ORDER — DICYCLOMINE HCL 10 MG PO CAPS: 10.0000 mg | ORAL_CAPSULE | Freq: Two times a day (BID) | ORAL | 3 refills | Status: DC

## 2017-06-22 NOTE — Telephone Encounter (Signed)
Medication sent to patient's pharmacy.

## 2017-06-25 NOTE — Telephone Encounter (Signed)
Patient states she is needing dicyclomine 10mg  sent to [facility]. Fax# [phone] phone#[phone]. Pt states they have not received prescription.

## 2017-06-26 MED ORDER — DICYCLOMINE HCL 10 MG PO CAPS: 10.0000 mg | ORAL_CAPSULE | Freq: Two times a day (BID) | ORAL | 3 refills | Status: DC

## 2017-06-26 NOTE — Addendum Note (Signed)
Addended by: Karena Addison on: 06/26/2017 04:04 PM   Modules accepted: Orders

## 2017-07-19 ENCOUNTER — Encounter: Payer: Self-pay | Admitting: Podiatry

## 2017-07-19 ENCOUNTER — Ambulatory Visit (INDEPENDENT_AMBULATORY_CARE_PROVIDER_SITE_OTHER): Payer: Medicare Other | Admitting: Podiatry

## 2017-07-19 DIAGNOSIS — G5762 Lesion of plantar nerve, left lower limb: Secondary | ICD-10-CM | POA: Diagnosis not present

## 2017-07-19 DIAGNOSIS — M778 Other enthesopathies, not elsewhere classified: Secondary | ICD-10-CM

## 2017-07-19 DIAGNOSIS — M779 Enthesopathy, unspecified: Secondary | ICD-10-CM

## 2017-07-19 NOTE — Progress Notes (Signed)
She presents today for follow-up of her neuroma third interspace of the right foot.  Objective: Vital signs are stable he is alert and oriented x3.  Pulses are palpable palpable Mulder's click to third interdigital space of the left foot.  Assessment: Neuroma third interspace left.  Plan: Reinjected her today with 2 cc of 4% dehydrated alcohol after sterile alcohol prep.  She tolerated procedure well without complications follow-up with me when she returns from [place].

## 2017-10-18 ENCOUNTER — Encounter: Payer: Self-pay | Admitting: Podiatry

## 2017-10-18 ENCOUNTER — Ambulatory Visit (INDEPENDENT_AMBULATORY_CARE_PROVIDER_SITE_OTHER): Payer: Medicare Other | Admitting: Podiatry

## 2017-10-18 DIAGNOSIS — H11159 Pinguecula, unspecified eye: Secondary | ICD-10-CM | POA: Insufficient documentation

## 2017-10-18 DIAGNOSIS — G5762 Lesion of plantar nerve, left lower limb: Secondary | ICD-10-CM

## 2017-10-18 DIAGNOSIS — M778 Other enthesopathies, not elsewhere classified: Secondary | ICD-10-CM

## 2017-10-18 DIAGNOSIS — H16229 Keratoconjunctivitis sicca, not specified as Sjogren's, unspecified eye: Secondary | ICD-10-CM | POA: Insufficient documentation

## 2017-10-18 DIAGNOSIS — H526 Other disorders of refraction: Secondary | ICD-10-CM | POA: Insufficient documentation

## 2017-10-18 DIAGNOSIS — M779 Enthesopathy, unspecified: Secondary | ICD-10-CM | POA: Diagnosis not present

## 2017-10-18 DIAGNOSIS — H04129 Dry eye syndrome of unspecified lacrimal gland: Secondary | ICD-10-CM | POA: Insufficient documentation

## 2017-10-18 DIAGNOSIS — H26493 Other secondary cataract, bilateral: Secondary | ICD-10-CM | POA: Insufficient documentation

## 2017-10-18 DIAGNOSIS — H04123 Dry eye syndrome of bilateral lacrimal glands: Secondary | ICD-10-CM | POA: Insufficient documentation

## 2017-10-18 DIAGNOSIS — G5782 Other specified mononeuropathies of left lower limb: Secondary | ICD-10-CM

## 2017-10-18 DIAGNOSIS — H2510 Age-related nuclear cataract, unspecified eye: Secondary | ICD-10-CM | POA: Insufficient documentation

## 2017-10-18 DIAGNOSIS — H35371 Puckering of macula, right eye: Secondary | ICD-10-CM | POA: Insufficient documentation

## 2017-10-18 DIAGNOSIS — I10 Essential (primary) hypertension: Secondary | ICD-10-CM | POA: Insufficient documentation

## 2017-10-20 NOTE — Progress Notes (Signed)
She presents today for follow-up of neuroma third interspace of the left foot and capsulitis with arthritis of the first metatarsophalangeal joint of the right foot.  Objective: Vital signs are stable alert and oriented x3.  Pulses are palpable.  Neurologic sensorium is intact however she does have palpable Mulder's click third interspace of the left foot.  She also has pain on end range of motion of the first metatarsophalangeal joint of the right foot.  Otherwise no open lesions or wounds are noted.  Assessment: Pain in limb secondary to capsulitis first metatarsophalangeal joint right foot with arthritis.  Neuroma third interspace left foot.  Plan: Discussed etiology pathology conservative therapies at this point injected dexamethasone after sterile Betadine skin prep total of 2 mg with local anesthetic was injected to the first metatarsophalangeal joint of the right foot.  Also injected 2 cc of 4% dehydrated alcohol with local anesthetic to the point maximal tenderness of the third interspace of the left foot.  Tolerated procedure well we will follow-up with her in 1 month.

## 2017-10-23 ENCOUNTER — Telehealth: Payer: Self-pay | Admitting: Podiatry

## 2017-10-23 NOTE — Telephone Encounter (Signed)
It will continue to ease off over the next few days.

## 2017-10-23 NOTE — Telephone Encounter (Signed)
I've been seeing Dr. Milinda Pointer for injections to kill a nerve. I saw him this past Thursday, 01 August and I received injection. It's hurting worse than it was before I got the injection. Walking is very painful on that foot unless I walk on my heel or turn my foot to the right. I don't know if its supposed to be this way or not? If you could call me at [phone]. I know Dr. Milinda Pointer is in on Tuesday's and Thursday's. Please call me back and let me know if this is normal or not.

## 2017-10-23 NOTE — Telephone Encounter (Signed)
I informed pt of Dr. Stephenie Acres statement and to continue with the ice and stiff wide shoe and decrease activity, OTC ibuprofen as package directs if tolerates. Pt states understanding.

## 2017-10-23 NOTE — Telephone Encounter (Signed)
I spoke with pt and she states injection before last there was no problem, the area felt mushy, but not as painful, has had difficulty taking her walks, but had taken one today. I instructed pt to begin ice therapy 3-4 times daily for 15 minutes/session protecting the skin from the ice with a light fabric and stiffer bottom shoes to decrease bend in the painful area. I asked pt if she could take oral antiinflammatories and she states yes. I told her I would inform Dr. Milinda Pointer and call again.

## 2017-11-27 ENCOUNTER — Encounter: Payer: Self-pay | Admitting: Podiatry

## 2017-11-27 ENCOUNTER — Ambulatory Visit (INDEPENDENT_AMBULATORY_CARE_PROVIDER_SITE_OTHER): Payer: Medicare Other | Admitting: Podiatry

## 2017-11-27 DIAGNOSIS — M778 Other enthesopathies, not elsewhere classified: Secondary | ICD-10-CM

## 2017-11-27 DIAGNOSIS — G5762 Lesion of plantar nerve, left lower limb: Secondary | ICD-10-CM

## 2017-11-27 DIAGNOSIS — M779 Enthesopathy, unspecified: Secondary | ICD-10-CM | POA: Diagnosis not present

## 2017-11-27 DIAGNOSIS — G5782 Other specified mononeuropathies of left lower limb: Secondary | ICD-10-CM

## 2017-11-27 NOTE — Progress Notes (Signed)
She presents today chief complaint of pain around the first metatarsophalangeal joint right foot and third interdigital space of her left foot.  She has considered surgical intervention though states that she is not ready for it as of yet.  Objective: Vital signs are stable she is alert and oriented x3.  Has severe pain on range of motion of the first metatarsophalangeal joint right foot.  She also has pain on palpation of the third interdigital space of the left foot with palpable palpable Mulder's click.  Assessment: Pain in limb secondary to capsulitis osteoarthritic changes first metatarsophalangeal joint right foot neuroma third interdigital space left foot.  Plan: Discussed etiology pathology conservative surgical therapies this point time injected theright first metatarsophalangeal joint with 20 mg Kenalog 5 mg of Kenalog through a 30-gauge needle after sterile Betadine skin prep.  Injected 2 cc of dehydrated alcohol of after sterile Betadine skin prep third interdigital space left foot.  Follow-up with her in 2 weeks to reinject the neuroma.  Remember to thank her for the jelly.

## 2017-12-11 ENCOUNTER — Ambulatory Visit (INDEPENDENT_AMBULATORY_CARE_PROVIDER_SITE_OTHER): Payer: Medicare Other | Admitting: Podiatry

## 2017-12-11 DIAGNOSIS — G5782 Other specified mononeuropathies of left lower limb: Secondary | ICD-10-CM

## 2017-12-11 DIAGNOSIS — G5762 Lesion of plantar nerve, left lower limb: Secondary | ICD-10-CM

## 2017-12-12 NOTE — Progress Notes (Signed)
She presents today for two-week follow-up of neuroma left foot.  She states that seems to be doing a little better here for the next injection.  Objective: Vital signs are stable alert and oriented x3.  Pulses are palpable.  Still has pain on palpation third interdigital space of the left foot.  Assessment: Neuroma third interdigital space left foot.  Plan: After sterile Betadine skin prep I injected 2 cc of a 4% dehydrated alcohol with local anesthetic third interdigital space left foot this was her seconds dehydrated alcohol injection.

## 2017-12-25 ENCOUNTER — Other Ambulatory Visit: Payer: Self-pay | Admitting: Nurse Practitioner

## 2017-12-25 DIAGNOSIS — Z0000 Encounter for general adult medical examination without abnormal findings: Secondary | ICD-10-CM | POA: Diagnosis not present

## 2017-12-25 DIAGNOSIS — I700 Atherosclerosis of aorta: Secondary | ICD-10-CM | POA: Diagnosis not present

## 2017-12-25 DIAGNOSIS — R102 Pelvic and perineal pain: Secondary | ICD-10-CM | POA: Diagnosis not present

## 2017-12-25 DIAGNOSIS — I10 Essential (primary) hypertension: Secondary | ICD-10-CM | POA: Diagnosis not present

## 2017-12-25 DIAGNOSIS — M85851 Other specified disorders of bone density and structure, right thigh: Secondary | ICD-10-CM | POA: Diagnosis not present

## 2017-12-25 DIAGNOSIS — Z1231 Encounter for screening mammogram for malignant neoplasm of breast: Secondary | ICD-10-CM

## 2017-12-25 DIAGNOSIS — E785 Hyperlipidemia, unspecified: Secondary | ICD-10-CM | POA: Diagnosis not present

## 2017-12-25 DIAGNOSIS — M85852 Other specified disorders of bone density and structure, left thigh: Secondary | ICD-10-CM | POA: Diagnosis not present

## 2017-12-25 DIAGNOSIS — Z1239 Encounter for other screening for malignant neoplasm of breast: Secondary | ICD-10-CM | POA: Diagnosis not present

## 2017-12-29 DIAGNOSIS — Z23 Encounter for immunization: Secondary | ICD-10-CM | POA: Diagnosis not present

## 2018-01-03 ENCOUNTER — Ambulatory Visit (INDEPENDENT_AMBULATORY_CARE_PROVIDER_SITE_OTHER): Payer: Medicare Other | Admitting: Podiatry

## 2018-01-03 ENCOUNTER — Encounter: Payer: Self-pay | Admitting: Podiatry

## 2018-01-03 DIAGNOSIS — G5782 Other specified mononeuropathies of left lower limb: Secondary | ICD-10-CM

## 2018-01-03 DIAGNOSIS — G5762 Lesion of plantar nerve, left lower limb: Secondary | ICD-10-CM | POA: Diagnosis not present

## 2018-01-03 NOTE — Progress Notes (Signed)
She presents today for follow-up of her neuroma third interdigital space of the left foot she states that still feels some pain but I feel that is starting to turn the corner.  Objective: Vital signs stable alert and oriented x3.  Pulses are palpable.  Palpable Mulder's click third interspace left foot much decreased in pain from previous evaluation.  Assessment: Neuroma resolving third interspace left.  Plan: At this point I injected another dose of dehydrated alcohol 4% 2 cc with local anesthetic.  She tolerated procedure well without complications follow-up with her in about 3 weeks.

## 2018-01-09 DIAGNOSIS — L570 Actinic keratosis: Secondary | ICD-10-CM | POA: Diagnosis not present

## 2018-01-09 DIAGNOSIS — D229 Melanocytic nevi, unspecified: Secondary | ICD-10-CM | POA: Diagnosis not present

## 2018-01-23 DIAGNOSIS — L570 Actinic keratosis: Secondary | ICD-10-CM | POA: Diagnosis not present

## 2018-01-24 ENCOUNTER — Ambulatory Visit (INDEPENDENT_AMBULATORY_CARE_PROVIDER_SITE_OTHER): Payer: Medicare Other | Admitting: Podiatry

## 2018-01-24 ENCOUNTER — Encounter: Payer: Self-pay | Admitting: Podiatry

## 2018-01-24 DIAGNOSIS — G5762 Lesion of plantar nerve, left lower limb: Secondary | ICD-10-CM | POA: Diagnosis not present

## 2018-01-24 DIAGNOSIS — G5782 Other specified mononeuropathies of left lower limb: Secondary | ICD-10-CM

## 2018-01-24 NOTE — Progress Notes (Signed)
She presents today for follow-up of neuroma left foot.  States that is still hurts but is getting better.  Objective: Pulses are palpable.  She has palpable Mulder's click third interspace of the left foot less pain than previously noted.  Assessment: Slowly resolving neuroma third interspace of the left foot.  Plan: After sterile Betadine skin prep I provided her with a 2 cc injection of a 4% dehydrated alcohol to the point maximal tenderness left foot.  Follow-up with her in 3 to 4 weeks.

## 2018-01-31 ENCOUNTER — Ambulatory Visit
Admission: RE | Admit: 2018-01-31 | Discharge: 2018-01-31 | Disposition: A | Discharge status: Home / Self Care | Payer: Medicare Other | Source: Ambulatory Visit | Attending: Nurse Practitioner | Admitting: Nurse Practitioner

## 2018-01-31 DIAGNOSIS — Z1231 Encounter for screening mammogram for malignant neoplasm of breast: Secondary | ICD-10-CM | POA: Diagnosis not present

## 2018-02-26 ENCOUNTER — Encounter: Payer: Self-pay | Admitting: Podiatry

## 2018-02-26 ENCOUNTER — Ambulatory Visit (INDEPENDENT_AMBULATORY_CARE_PROVIDER_SITE_OTHER): Payer: Medicare Other | Admitting: Podiatry

## 2018-02-26 DIAGNOSIS — G5762 Lesion of plantar nerve, left lower limb: Secondary | ICD-10-CM

## 2018-02-26 DIAGNOSIS — G5782 Other specified mononeuropathies of left lower limb: Secondary | ICD-10-CM

## 2018-02-26 NOTE — Progress Notes (Signed)
She presents today for follow-up of her neuroma to the third interdigital space of the left foot.  She states that is doing better but still painful.  Objective: Vital signs are stable alert and oriented x3.  Pulses are palpable.  Palpable Mulder's click third interdigital space of the left foot.  Assessment: Neuroma third interspace left.  Plan: Injected another dose of dehydrated alcohol to the third interdigital space of the left foot today.  Tolerated procedure well without comp occasions.  Follow-up with her in 1 month.

## 2018-03-26 ENCOUNTER — Ambulatory Visit (INDEPENDENT_AMBULATORY_CARE_PROVIDER_SITE_OTHER): Payer: Medicare Other | Admitting: Podiatry

## 2018-03-26 ENCOUNTER — Encounter: Payer: Self-pay | Admitting: Podiatry

## 2018-03-26 DIAGNOSIS — G5762 Lesion of plantar nerve, left lower limb: Secondary | ICD-10-CM | POA: Diagnosis not present

## 2018-03-26 DIAGNOSIS — G5782 Other specified mononeuropathies of left lower limb: Secondary | ICD-10-CM

## 2018-03-26 NOTE — Progress Notes (Signed)
She presents today concerned about pain up into the third intermetatarsal space of the left foot.  States that is still hurts at some point usually gets better between injections but at this point it did not.  Objective: Vital signs are stable she is alert and oriented x3.  Pulses are palpable.  Neurologic sensorium is intact but she is has pain on palpation with a palpable Mulder's click third interspace of the left foot very tender.  Assessment: Neuroma third interspace left.  Plan: At this point reinjected dehydrated alcohol today may need to consider surgical removal at some point.

## 2018-04-03 DIAGNOSIS — D229 Melanocytic nevi, unspecified: Secondary | ICD-10-CM | POA: Diagnosis not present

## 2018-04-03 DIAGNOSIS — L570 Actinic keratosis: Secondary | ICD-10-CM | POA: Diagnosis not present

## 2018-04-12 DIAGNOSIS — H52203 Unspecified astigmatism, bilateral: Secondary | ICD-10-CM | POA: Diagnosis not present

## 2018-04-12 DIAGNOSIS — Z961 Presence of intraocular lens: Secondary | ICD-10-CM | POA: Diagnosis not present

## 2018-04-12 DIAGNOSIS — H35371 Puckering of macula, right eye: Secondary | ICD-10-CM | POA: Diagnosis not present

## 2018-04-16 ENCOUNTER — Ambulatory Visit (INDEPENDENT_AMBULATORY_CARE_PROVIDER_SITE_OTHER): Payer: Medicare Other | Admitting: Podiatry

## 2018-04-16 ENCOUNTER — Ambulatory Visit (INDEPENDENT_AMBULATORY_CARE_PROVIDER_SITE_OTHER): Payer: Medicare Other

## 2018-04-16 ENCOUNTER — Other Ambulatory Visit: Payer: Self-pay | Admitting: Podiatry

## 2018-04-16 DIAGNOSIS — M205X1 Other deformities of toe(s) (acquired), right foot: Secondary | ICD-10-CM | POA: Diagnosis not present

## 2018-04-16 DIAGNOSIS — G5762 Lesion of plantar nerve, left lower limb: Secondary | ICD-10-CM | POA: Diagnosis not present

## 2018-04-16 DIAGNOSIS — M79671 Pain in right foot: Secondary | ICD-10-CM

## 2018-04-16 NOTE — Patient Instructions (Signed)
Pre-Operative Instructions  Congratulations, you have decided to take an important step towards improving your quality of life.  You can be assured that the doctors and staff at [facility] will be with you every step of the way.  Here are some important things you should know:  1. Plan to be at the surgery center/hospital at least 1 (one) hour prior to your scheduled time, unless otherwise directed by the surgical center/hospital staff.  You must have a responsible adult accompany you, remain during the surgery and drive you home.  Make sure you have directions to the surgical center/hospital to ensure you arrive on time. 2. If you are having surgery at [facility] or [facility], you will need a copy of your medical history and physical form from your family physician within one month prior to the date of surgery. We will give you a form for your primary physician to complete.  3. We make every effort to accommodate the date you request for surgery.  However, there are times where surgery dates or times have to be moved.  We will contact you as soon as possible if a change in schedule is required.   4. No aspirin/ibuprofen for one week before surgery.  If you are on aspirin, any non-steroidal anti-inflammatory medications (Mobic, Aleve, Ibuprofen) should not be taken seven (7) days prior to your surgery.  You make take Tylenol for pain prior to surgery.  5. Medications - If you are taking daily heart and blood pressure medications, seizure, reflux, allergy, asthma, anxiety, pain or diabetes medications, make sure you notify the surgery center/hospital before the day of surgery so they can tell you which medications you should take or avoid the day of surgery. 6. No food or drink after midnight the night before surgery unless directed otherwise by surgical center/hospital staff. 7. No alcoholic beverages 24-hours prior to surgery.  No smoking 24-hours prior or 24-hours after  surgery. 8. Wear loose pants or shorts. They should be loose enough to fit over bandages, boots, and casts. 9. Don't wear slip-on shoes. Sneakers are preferred. 10. Bring your boot with you to the surgery center/hospital.  Also bring crutches or a walker if your physician has prescribed it for you.  If you do not have this equipment, it will be provided for you after surgery. 11. If you have not been contacted by the surgery center/hospital by the day before your surgery, call to confirm the date and time of your surgery. 12. Leave-time from work may vary depending on the type of surgery you have.  Appropriate arrangements should be made prior to surgery with your employer. 13. Prescriptions will be provided immediately following surgery by your doctor.  Fill these as soon as possible after surgery and take the medication as directed. Pain medications will not be refilled on weekends and must be approved by the doctor. 14. Remove nail polish on the operative foot and avoid getting pedicures prior to surgery. 15. Wash the night before surgery.  The night before surgery wash the foot and leg well with water and the antibacterial soap provided. Be sure to pay special attention to beneath the toenails and in between the toes.  Wash for at least three (3) minutes. Rinse thoroughly with water and dry well with a towel.  Perform this wash unless told not to do so by your physician.  Enclosed: 1 Ice pack (please put in freezer the night before surgery)   1 Hibiclens skin cleaner     Pre-op instructions  If you have any questions regarding the instructions, please do not hesitate to call our office.  [place]: [address], [place], [place] [address] -- [phone]  [place]: [address] [place], [place] [address] -- [phone]  [place]: [address]  [place], [place] [address] -- [phone]  [place]: [address], [address], [place], [place] [address] -- [phone]  Website:  [email or web address] 

## 2018-04-17 NOTE — Progress Notes (Signed)
She presents today for chief complaint of a painful first metatarsal phalangeal joint of the right foot.  States that the last injection really did not help at all.  She is ready to have this thing surgically corrected if at all possible.  She states that she would like to have it soon so that she can rehab and be ready for spring time.  She also relates that the neuroma to the third interdigital space of the left foot is still tender but tolerable.  At this point she does not want another injection at least until the time of surgery.  Objective: Vital signs are stable alert and oriented x3 have reviewed her past medical history medications allergies surgeries and social history with her today she has pain on range of motion and on palpation of the first metatarsophalangeal joint of the right foot.  There is no crepitus and there is no hallux valgus deformity.  Radiographs taken today do demonstrate a joint space narrowing and some subchondral sclerosis when compared to previous radiographs taken in 2016.  No open lesions or wounds.  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  Plan: Discussed etiology pathology conservative versus surgical therapies.  We consented her today for a Keller arthroplasty with single silicone implant right first metatarsal phalangeal joint.  We did discuss the possible postop complications related to this procedure which may include but not limited to postop pain bleeding swelling infection recurrence need further surgery overcorrection under correction also digit loss of limb loss of life.  She understands this and is amenable to it.  We dispensed a Cam walker today as well as per both oral and written instructions regarding preop instructions and information regarding the surgery center and anesthesia group.  I will follow-up with her in the near future for surgical intervention.

## 2018-04-24 ENCOUNTER — Other Ambulatory Visit: Payer: Self-pay | Admitting: Podiatry

## 2018-04-24 DIAGNOSIS — M180 Bilateral primary osteoarthritis of first carpometacarpal joints: Secondary | ICD-10-CM | POA: Diagnosis not present

## 2018-04-24 MED ORDER — CEPHALEXIN 500 MG PO CAPS: 500.0000 mg | ORAL_CAPSULE | Freq: Three times a day (TID) | ORAL | 0 refills | Status: DC

## 2018-04-24 MED ORDER — OXYCODONE-ACETAMINOPHEN 10-325 MG PO TABS: 1.0000 | ORAL_TABLET | Freq: Four times a day (QID) | ORAL | 0 refills | Status: AC | PRN

## 2018-04-24 MED ORDER — ONDANSETRON HCL 4 MG PO TABS: 4.0000 mg | ORAL_TABLET | Freq: Three times a day (TID) | ORAL | 0 refills | Status: DC | PRN

## 2018-04-26 ENCOUNTER — Encounter: Payer: Self-pay | Admitting: Podiatry

## 2018-04-26 DIAGNOSIS — M21611 Bunion of right foot: Secondary | ICD-10-CM | POA: Diagnosis not present

## 2018-04-26 DIAGNOSIS — E7800 Pure hypercholesterolemia, unspecified: Secondary | ICD-10-CM | POA: Diagnosis not present

## 2018-04-26 DIAGNOSIS — M19071 Primary osteoarthritis, right ankle and foot: Secondary | ICD-10-CM | POA: Diagnosis not present

## 2018-04-26 DIAGNOSIS — M25774 Osteophyte, right foot: Secondary | ICD-10-CM | POA: Diagnosis not present

## 2018-04-26 DIAGNOSIS — M205X1 Other deformities of toe(s) (acquired), right foot: Secondary | ICD-10-CM | POA: Diagnosis not present

## 2018-04-26 DIAGNOSIS — M2021 Hallux rigidus, right foot: Secondary | ICD-10-CM | POA: Diagnosis not present

## 2018-04-26 DIAGNOSIS — M25571 Pain in right ankle and joints of right foot: Secondary | ICD-10-CM | POA: Diagnosis not present

## 2018-05-02 ENCOUNTER — Ambulatory Visit (INDEPENDENT_AMBULATORY_CARE_PROVIDER_SITE_OTHER): Payer: Medicare Other | Admitting: Podiatry

## 2018-05-02 ENCOUNTER — Ambulatory Visit (INDEPENDENT_AMBULATORY_CARE_PROVIDER_SITE_OTHER): Payer: Medicare Other

## 2018-05-02 VITALS — Temp 97.3°F

## 2018-05-02 DIAGNOSIS — G5762 Lesion of plantar nerve, left lower limb: Secondary | ICD-10-CM

## 2018-05-02 DIAGNOSIS — M205X1 Other deformities of toe(s) (acquired), right foot: Secondary | ICD-10-CM

## 2018-05-02 DIAGNOSIS — G5782 Other specified mononeuropathies of left lower limb: Secondary | ICD-10-CM

## 2018-05-02 NOTE — Progress Notes (Signed)
She presents today for her first postop visit date of surgery April 26, 2018 status post Jake Michaelis bunionectomy with implant right foot states that her throat foot is been throbbing at times and she has been taking ibuprofen and Tylenol combination.  She denies fever chills nausea vomiting muscle aches pains calf pain back pain chest pain shortness of breath.  Objective: Vital signs are stable alert and oriented x3.  Pulses are palpable.  Neurologic sensorium is intact.  Degenerative flexors are intact.  She has good range of motion of the first metatarsal phalangeal joint of the right foot.  Radiographs confirm well-healing Keller arthroplasty with single silicone implants soft tissue appears to be healing very nicely.  Assessment: Well-healing surgical foot status post 1 week April 26, 2018.  Plan: Redressed today dressed compressive dressing encourage range of motion exercises keep dry and clean.

## 2018-05-09 ENCOUNTER — Ambulatory Visit (INDEPENDENT_AMBULATORY_CARE_PROVIDER_SITE_OTHER): Payer: Medicare Other | Admitting: Podiatry

## 2018-05-09 DIAGNOSIS — M205X1 Other deformities of toe(s) (acquired), right foot: Secondary | ICD-10-CM | POA: Diagnosis not present

## 2018-05-09 DIAGNOSIS — Z09 Encounter for follow-up examination after completed treatment for conditions other than malignant neoplasm: Secondary | ICD-10-CM

## 2018-05-09 NOTE — Progress Notes (Signed)
She presents today for second postop visit date of surgery April 26, 2018 status post Jake Michaelis bunionectomy right foot.  States my foot is of had sharp shooting pains at times of been up on it trying to do my exercises my toe raises and calf raises.  Objective: Vital signs are stable she is alert and oriented x3.  Pulses are palpable.  Rest of dressing intact.  Incision appears to be healing very nicely.  Sutures were removed today.  She has good range of motion of the toe though tender.  Assessment: Well-healing surgical foot.  Plan: Redressed today with a compression anklet and a Darco shoe instructed her that she can start washing this keeping it elevated as much as possible and I will follow-up with her in a couple weeks.  May need to consider physical therapy.

## 2018-05-28 ENCOUNTER — Ambulatory Visit (INDEPENDENT_AMBULATORY_CARE_PROVIDER_SITE_OTHER): Payer: Medicare Other

## 2018-05-28 ENCOUNTER — Ambulatory Visit (INDEPENDENT_AMBULATORY_CARE_PROVIDER_SITE_OTHER): Payer: Medicare Other | Admitting: Podiatry

## 2018-05-28 ENCOUNTER — Encounter: Payer: Self-pay | Admitting: Podiatry

## 2018-05-28 DIAGNOSIS — M205X1 Other deformities of toe(s) (acquired), right foot: Secondary | ICD-10-CM

## 2018-05-28 DIAGNOSIS — Z9889 Other specified postprocedural states: Secondary | ICD-10-CM

## 2018-05-28 NOTE — Progress Notes (Signed)
She presents today for a postop meeting she is status post 1 month now date of surgery April 26, 2018 Keller bunion implant states is swollen and it hurts some.  Objective: Vital signs are stable alert and oriented x3.  Pulses are palpable.  Neurologic sensorium is intact.  dT reflexes are intact.  Muscle strength is normal symmetrical.  She had good range of motion dorsiflexion mild swelling no ecchymosis.  No open lesions or wounds.  Radiographs taken today demonstrate a well-healing Keller bunion implant with grommets.  Assessment: Well-healing surgical foot.  Plan: Follow-up with me in 1 month and will allow her to get back to regular tennis shoes and start walking and exercising.

## 2018-06-11 ENCOUNTER — Encounter: Payer: Medicare Other | Admitting: Podiatry

## 2018-06-25 ENCOUNTER — Ambulatory Visit (INDEPENDENT_AMBULATORY_CARE_PROVIDER_SITE_OTHER): Payer: Medicare Other | Admitting: Podiatry

## 2018-06-25 ENCOUNTER — Ambulatory Visit (INDEPENDENT_AMBULATORY_CARE_PROVIDER_SITE_OTHER): Payer: Medicare Other

## 2018-06-25 ENCOUNTER — Encounter: Payer: Self-pay | Admitting: Podiatry

## 2018-06-25 ENCOUNTER — Other Ambulatory Visit: Payer: Self-pay

## 2018-06-25 VITALS — Temp 97.5°F

## 2018-06-25 DIAGNOSIS — Z9889 Other specified postprocedural states: Secondary | ICD-10-CM

## 2018-06-25 DIAGNOSIS — M205X1 Other deformities of toe(s) (acquired), right foot: Secondary | ICD-10-CM

## 2018-06-25 NOTE — Progress Notes (Signed)
She presents today date of surgery April 26, 2018 status post Jake Michaelis bunionectomy right foot.  States that is still really sore and it swells a lot 2 she is complaining of pain to the third interspace of the left foot states that her neuroma is acting up again.  Objective: Vital signs are stable she is alert and oriented x3.  Pulses are palpable.  She has good range of motion of the first metatarsal phalangeal joint of the right foot moderately swollen though.  No erythema cellulitis drainage odor radiographs taken of the right foot today demonstrate a well-healing surgical foot though considerable swelling at the first intermetatarsal space.  She also has pain on palpation to the third interdigital space with a palpable Mulder's click left foot.  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  Well-healed surgical foot right.  Date of surgery 04/26/2018.  Plan: At this point of sterile Betadine skin prep injected 2 cc 4% dehydrated alcohol with local anesthetic third interspace of the left foot tolerated procedure well continue range of motion exercises and activity for the right foot.  Follow-up with her in 3 weeks for the neuroma.

## 2018-07-16 ENCOUNTER — Ambulatory Visit (INDEPENDENT_AMBULATORY_CARE_PROVIDER_SITE_OTHER): Payer: Medicare Other

## 2018-07-16 ENCOUNTER — Ambulatory Visit (INDEPENDENT_AMBULATORY_CARE_PROVIDER_SITE_OTHER): Payer: Medicare Other | Admitting: Podiatry

## 2018-07-16 ENCOUNTER — Encounter: Payer: Self-pay | Admitting: Podiatry

## 2018-07-16 ENCOUNTER — Other Ambulatory Visit: Payer: Self-pay

## 2018-07-16 DIAGNOSIS — Z9889 Other specified postprocedural states: Secondary | ICD-10-CM

## 2018-07-16 DIAGNOSIS — G5762 Lesion of plantar nerve, left lower limb: Secondary | ICD-10-CM | POA: Diagnosis not present

## 2018-07-16 DIAGNOSIS — G5782 Other specified mononeuropathies of left lower limb: Secondary | ICD-10-CM

## 2018-07-16 DIAGNOSIS — M205X1 Other deformities of toe(s) (acquired), right foot: Secondary | ICD-10-CM | POA: Diagnosis not present

## 2018-07-16 NOTE — Progress Notes (Signed)
She presents today date of surgery April 26, 2018 status post Jake Michaelis bunionectomy right she states that it feels pretty good I finally got into a pair of regular flats.  Occasionally is tender particular right here she points to the hallux interphalangeal joint.  She is also here for follow-up of her neuroma third interdigital space left foot.  Objective: Vital signs are stable alert and oriented x3.  There is no erythema edema cellulitis drainage odor left palpable Mulder's click third interdigital space of the left foot.  Neuroma is still present.  It appears to be less painful.  She still has some tenderness and pain on palpation of the first metatarsal phalangeal joint and plantar hallux interphalangeal joint.  Assessment: Well-healing surgical foot right.  After sterile Betadine skin prep I injected 2 cc of a 4% dehydrated alcohol and local anesthetic to the left third interdigital space.  Follow-up with her when she returns from [place]

## 2018-08-10 DIAGNOSIS — Z0184 Encounter for antibody response examination: Secondary | ICD-10-CM | POA: Diagnosis not present

## 2018-08-10 DIAGNOSIS — Z1159 Encounter for screening for other viral diseases: Secondary | ICD-10-CM | POA: Diagnosis not present

## 2018-10-22 ENCOUNTER — Other Ambulatory Visit: Payer: Self-pay

## 2018-10-22 ENCOUNTER — Ambulatory Visit (INDEPENDENT_AMBULATORY_CARE_PROVIDER_SITE_OTHER): Payer: Medicare Other

## 2018-10-22 ENCOUNTER — Encounter: Payer: Self-pay | Admitting: Podiatry

## 2018-10-22 ENCOUNTER — Ambulatory Visit (INDEPENDENT_AMBULATORY_CARE_PROVIDER_SITE_OTHER): Payer: Medicare Other | Admitting: Podiatry

## 2018-10-22 VITALS — Temp 97.2°F

## 2018-10-22 DIAGNOSIS — M205X1 Other deformities of toe(s) (acquired), right foot: Secondary | ICD-10-CM | POA: Diagnosis not present

## 2018-10-22 DIAGNOSIS — Z9889 Other specified postprocedural states: Secondary | ICD-10-CM | POA: Diagnosis not present

## 2018-10-22 DIAGNOSIS — G5782 Other specified mononeuropathies of left lower limb: Secondary | ICD-10-CM

## 2018-10-22 DIAGNOSIS — G5762 Lesion of plantar nerve, left lower limb: Secondary | ICD-10-CM | POA: Diagnosis not present

## 2018-10-22 MED ORDER — MELOXICAM 15 MG PO TABS: 15.0000 mg | ORAL_TABLET | Freq: Every day | ORAL | 3 refills | Status: DC

## 2018-10-22 NOTE — Progress Notes (Signed)
She presents today after having not seen her since May for follow-up of her Keller arthroplasty single silicone implant right foot.  She denies fever chills nausea vomiting muscle aches pains states that the toe still is stiff and still tender if she overdoes it.  She is also following up for neuroma third interspace left foot states that I am really not feeling it that much and I am really not in the mood for injections.  She states that the right one are starting to act up a little bit but I do not want injections there either.  Objective: Vital signs are stable she is alert and oriented x3.  Pulses are palpable.  Neurologic sensorium is intact.  Deep tendon reflexes are intact she has good range of motion passively and actively first metatarsophalangeal joint she does not have full range of motion without me pushing aggressively on the joint.  This was mildly tender for her but it was to demonstrate that it can actually get to the position of which we require.  She has no pain on palpation of the third interdigital space right minimally so on the left.  Assessment: Date of surgery April 26, 2018 status post [facility] implant right nerve neuroma on the right side positive neuroma on the left side.  Plan: Discussed etiology pathology conservative therapies at this point time went ahead and start her on meloxicam 15 mg 1 p.o. daily and I will follow-up with her on an as-needed basis.

## 2018-10-29 ENCOUNTER — Telehealth: Payer: Self-pay | Admitting: Gastroenterology

## 2018-10-29 NOTE — Telephone Encounter (Signed)
Would you like to refill this medication?  

## 2018-10-31 MED ORDER — DICYCLOMINE HCL 10 MG PO CAPS: 10.0000 mg | ORAL_CAPSULE | Freq: Two times a day (BID) | ORAL | 6 refills | Status: DC

## 2018-10-31 NOTE — Telephone Encounter (Signed)
Sent refill to patients pharmacy. 

## 2018-10-31 NOTE — Telephone Encounter (Signed)
Please do Same dose as before 6 refills  RG

## 2018-12-16 ENCOUNTER — Telehealth: Payer: Self-pay | Admitting: Gastroenterology

## 2018-12-16 NOTE — Telephone Encounter (Signed)
Pt requested to [facility] [address].  Please send dicyclomine prescription there.  Pt asked for a call back to discuss anal itching issues.

## 2018-12-17 ENCOUNTER — Encounter: Payer: Self-pay | Admitting: Gastroenterology

## 2018-12-17 MED ORDER — DICYCLOMINE HCL 10 MG PO CAPS: 10.0000 mg | ORAL_CAPSULE | Freq: Two times a day (BID) | ORAL | 0 refills | Status: DC

## 2018-12-17 NOTE — Telephone Encounter (Signed)
I have made patient an appointment with Dr. Lyndel Safe to discuss the anal itching and sent in a refill.

## 2018-12-18 DIAGNOSIS — I10 Essential (primary) hypertension: Secondary | ICD-10-CM | POA: Diagnosis not present

## 2018-12-18 DIAGNOSIS — E785 Hyperlipidemia, unspecified: Secondary | ICD-10-CM | POA: Diagnosis not present

## 2018-12-18 DIAGNOSIS — I700 Atherosclerosis of aorta: Secondary | ICD-10-CM | POA: Diagnosis not present

## 2018-12-18 DIAGNOSIS — Z1239 Encounter for other screening for malignant neoplasm of breast: Secondary | ICD-10-CM | POA: Diagnosis not present

## 2018-12-18 DIAGNOSIS — R197 Diarrhea, unspecified: Secondary | ICD-10-CM | POA: Diagnosis not present

## 2018-12-19 ENCOUNTER — Other Ambulatory Visit: Payer: Self-pay

## 2018-12-19 ENCOUNTER — Ambulatory Visit (INDEPENDENT_AMBULATORY_CARE_PROVIDER_SITE_OTHER): Payer: Medicare Other | Admitting: Gastroenterology

## 2018-12-19 ENCOUNTER — Encounter: Payer: Self-pay | Admitting: Gastroenterology

## 2018-12-19 VITALS — BP 112/72 | HR 65 | Temp 97.7°F | Ht 62.0 in | Wt 145.5 lb

## 2018-12-19 DIAGNOSIS — K581 Irritable bowel syndrome with constipation: Secondary | ICD-10-CM | POA: Diagnosis not present

## 2018-12-19 MED ORDER — FLUTICASONE PROPIONATE 0.05 % EX CREA: TOPICAL_CREAM | Freq: Two times a day (BID) | CUTANEOUS | 2 refills | Status: AC

## 2018-12-19 MED ORDER — DICYCLOMINE HCL 10 MG PO CAPS: 10.0000 mg | ORAL_CAPSULE | Freq: Three times a day (TID) | ORAL | 11 refills | Status: DC

## 2018-12-19 NOTE — Progress Notes (Signed)
Chief Complaint:   Referring Provider:  Leeroy Cha,*      ASSESSMENT AND PLAN;   #1. 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 Neg colon 10/2016 except for mild left colonic diverticulosis and internal hemorrhoids. Random colon positive for lymphocytic colitis.  Responded well to Bentyl. Neg CT abdo/pel 12/01/2016  #2. Rectal itching likely due to perirectal excoriation.  #3.  GERD with small hiatal hernia on CT.  Plan: - Bentyl 10mg  po qid prn #120, 11 refills - She is having physical on  Oct 12. Forward blood tests to [place]. - TUMS 1 bid , contiue tagamet  - Trial of fluticasone cream 0.05% generic 30g 1 bid PR x 10 days, 2 refills - Sitz baths BID x 7 days. - Avoid NSAIDs. - Call if still with problems in 2 weeks. - FU in 12 weeks.  If still has problems or he starts having recurrent diarrhea, will give her a trial of budesonide.  HPI:    Ashley Brennan is a 73 y.o. female   Here for prescription refill for Bentyl.   Took antibiotics for dental procedures Then had diarrhea which is better now. Complains of rectal itching or burning especially when she wipes.  She is having frequent but normal bowel movements. occ hearburn.  No nausea, vomiting, regurgitation, odynophagia or dysphagia.  No significant diarrhea or constipation.  There is no melena or hematochezia. No unintentional weight loss.  Denies having any abdominal pain.  Has physical coming up in the next few days.  Still takes magnesium on as needed basis.   Past Medical History:  Diagnosis Date  . Arthritis    Stated she get shots in her hands and feet  . Asthma   . Back pain   . Diverticulosis   . Fecal incontinence   . Flatulence   . GERD (gastroesophageal reflux disease)   . History of colon polyps   . HTN (hypertension)   . Hyperlipidemia   . IBS (irritable bowel syndrome)   . Seasonal allergies     Past Surgical History:  Procedure Laterality Date  . ABDOMINAL  HYSTERECTOMY    . BACK SURGERY    . COLONOSCOPY  11/03/2016   Diverticulosis of colon. Internal hemorrhoids  . FOOT SURGERY Right    said they put her joint in her foot. [facility]  . KNEE SURGERY      Family History  Problem Relation Age of Onset  . Prostate cancer Father   . Lung cancer Father   . Colon cancer Neg Hx   . Esophageal cancer Neg Hx     Social History   Tobacco Use  . Smoking status: Former Research scientist (life sciences)  . Smokeless tobacco: Never Used  Substance Use Topics  . Alcohol use: Yes    Comment: glass of wine with tonic  . Drug use: Not Currently    Current Outpatient Medications  Medication Sig Dispense Refill  . Acetaminophen (TYLENOL PO) Take 1 tablet by mouth as needed.    Marland Kitchen atorvastatin (LIPITOR) 20 MG tablet Take 20 mg by mouth daily.    Marland Kitchen dicyclomine (BENTYL) 10 MG capsule Take 1 capsule (10 mg total) by mouth 2 (two) times daily. 60 capsule 0  . IBUPROFEN PO Take 1 tablet by mouth as needed.    Marland Kitchen LOSARTAN POTASSIUM PO Take 1 tablet by mouth daily. Says she takes a 5mg     . Polyethyl Glycol-Propyl Glycol (SYSTANE) 0.4-0.3 % GEL ophthalmic gel Apply 1 application to eye  as needed.     . verapamil (CALAN-SR) 120 MG CR tablet Take 120 mg by mouth daily.     . clindamycin (CLEOCIN) 300 MG capsule TK ONE C PO  QID TAT    . COLLAGEN PO Take by mouth daily.    . meloxicam (MOBIC) 15 MG tablet Take 1 tablet (15 mg total) by mouth daily. (Patient not taking: Reported on 12/19/2018) 30 tablet 3   No current facility-administered medications for this visit.     No Known Allergies  Review of Systems:  Constitutional: Denies fever, chills, diaphoresis, appetite change and fatigue.  HEENT: Denies photophobia, eye pain, redness, hearing loss, ear pain, congestion, sore throat, rhinorrhea, sneezing, mouth sores, neck pain, neck stiffness and tinnitus.   Respiratory: Denies SOB, DOE, cough, chest tightness,  and wheezing.   Cardiovascular: Denies chest pain, palpitations and  leg swelling.  Genitourinary: Denies dysuria, urgency, frequency, hematuria, flank pain and difficulty urinating.  Musculoskeletal: Denies myalgias, back pain, joint swelling, arthralgias and gait problem.  Skin: No rash.  Neurological: Denies dizziness, seizures, syncope, weakness, light-headedness, numbness and headaches.  Hematological: Denies adenopathy. Easy bruising, personal or family bleeding history  Psychiatric/Behavioral: No anxiety or depression     Physical Exam:    BP 112/72   Pulse 65   Temp 97.7 F (36.5 C)   Ht 5\' 2"  (1.575 m)   Wt 145 lb 8 oz (66 kg)   BMI 26.61 kg/m  Filed Weights   12/19/18 0822  Weight: 145 lb 8 oz (66 kg)   Constitutional:  Well-developed, in no acute distress. Psychiatric: Normal mood and affect. Behavior is normal. HEENT: Pupils normal.  Conjunctivae are normal. No scleral icterus. Neck supple.  Cardiovascular: Normal rate, regular rhythm. No edema Pulmonary/chest: Effort normal and breath sounds normal. No wheezing, rales or rhonchi. Abdominal: Soft, nondistended. Nontender. Bowel sounds active throughout. There are no masses palpable. No hepatomegaly. Rectal: While perirectal excoriation, internal hemorrhoids, brown heme-negative stools. Seen in presence of Docia Chuck CMA Neurological: Alert and oriented to person place and time. Skin: Skin is warm and dry. No rashes noted.  Data Reviewed: I have personally reviewed following labs and imaging studies  CBC: CBC Latest Ref Rng & Units 08/19/2009  WBC 4.0 - 10.5 K/uL 6.1  Hemoglobin 12.0 - 15.0 g/dL 14.8  Hematocrit 36.0 - 46.0 % 43.2  Platelets 150 - 400 K/uL 325    CMP: CMP Latest Ref Rng & Units 08/19/2009  Glucose 70 - 99 mg/dL 99  BUN 6 - 23 mg/dL 11  Creatinine 0.4 - 1.2 mg/dL 1.10  Sodium 135 - 145 mEq/L 140  Potassium 3.5 - 5.1 mEq/L 3.8  Chloride 96 - 112 mEq/L 100  CO2 19 - 32 mEq/L 33(H)  Calcium 8.4 - 10.5 mg/dL 9.5  I spent 25 minutes of face-to-face time  with the patient. Greater than 50% of the time was spent counseling and coordinating care.     Carmell Austria, MD 12/19/2018, 8:41 AM  Cc: Leeroy Cha,*

## 2018-12-19 NOTE — Patient Instructions (Signed)
If you are age 73 or older, your body mass index should be between 23-30. Your Body mass index is 26.61 kg/m. If this is out of the aforementioned range listed, please consider follow up with your Primary Care Provider.  If you are age 52 or younger, your body mass index should be between 19-25. Your Body mass index is 26.61 kg/m. If this is out of the aformentioned range listed, please consider follow up with your Primary Care Provider.   We have sent the following medications to your pharmacy for you to pick up at your convenience: Bentyl 10 mg  Fluticasone Cream    Use a sitz bath twice daily x 7 days.   How to Take a [employer] A sitz bath is a warm water bath that may be used to care for your rectum, genital area, or the area between your rectum and genitals (perineum). For a sitz bath, the water only comes up to your hips and covers your buttocks. A sitz bath may done at home in a bathtub or with a portable sitz bath that fits over the toilet. Your health care provider may recommend a sitz bath to help:  Relieve pain and discomfort after delivering a baby.  Relieve pain and itching from hemorrhoids or anal fissures.  Relieve pain after certain surgeries.  Relax muscles that are sore or tight. How to take a sitz bath Take 3-4 sitz baths a day, or as many as told by your health care provider. Bathtub sitz bath To take a sitz bath in a bathtub: 1. Partially fill a bathtub with warm water. The water should be deep enough to cover your hips and buttocks when you are sitting in the tub. 2. If your health care provider told you to put medicine in the water, follow his or her instructions. 3. Sit in the water. 4. Open the tub drain a little, and leave it open during your bath. 5. Turn on the warm water again, enough to replace the water that is draining out. Keep the water running throughout your bath. This helps keep the water at the right level and the right temperature. 6. Soak in the  water for 15-20 minutes, or as long as told by your health care provider. 7. When you are done, be careful when you stand up. You may feel dizzy. 8. After the sitz bath, pat yourself dry. Do not rub your skin to dry it.  Over-the-toilet sitz bath To take a sitz bath with an over-the-toilet basin: 1. Follow the manufacturer's instructions. 2. Fill the basin with warm water. 3. If your health care provider told you to put medicine in the water, follow his or her instructions. 4. Sit on the seat. Make sure the water covers your buttocks and perineum. 5. Soak in the water for 15-20 minutes, or as long as told by your health care provider. 6. After the sitz bath, pat yourself dry. Do not rub your skin to dry it. 7. Clean and dry the basin between uses. 8. Discard the basin if it cracks, or according to the manufacturer's instructions. Contact a health care provider if:  Your symptoms get worse. Do not continue with sitz baths if your symptoms get worse.  You have new symptoms. If this happens, do not continue with sitz baths until you talk with your health care provider. Summary  A sitz bath is a warm water bath in which the water only comes up to your hips and covers your buttocks.  A sitz bath may help relieve itching, relieve pain, and relax muscles that are sore or tight in the lower part of your body, including your genital area.  Take 3-4 sitz baths a day, or as many as told by your health care provider. Soak in the water for 15-20 minutes.  Do not continue with sitz baths if your symptoms get worse. This information is not intended to replace advice given to you by your health care provider. Make sure you discuss any questions you have with your health care provider. Document Released: 11/27/2003 Document Revised: 03/08/2017 Document Reviewed: 03/08/2017 Elsevier Patient Education  [place].  Avoid NSAIDS.  Please purchase the following medications over the counter and  take as directed: Tums twice daily.   Please call Dr. Leland Her nurse Karl Pock, RN)  in 2 weeks at [phone]  to let her now how you are doing.   Thank you,  Dr. Jackquline Denmark

## 2018-12-31 DIAGNOSIS — E785 Hyperlipidemia, unspecified: Secondary | ICD-10-CM | POA: Diagnosis not present

## 2018-12-31 DIAGNOSIS — Z1239 Encounter for other screening for malignant neoplasm of breast: Secondary | ICD-10-CM | POA: Diagnosis not present

## 2018-12-31 DIAGNOSIS — J019 Acute sinusitis, unspecified: Secondary | ICD-10-CM | POA: Diagnosis not present

## 2018-12-31 DIAGNOSIS — Z1211 Encounter for screening for malignant neoplasm of colon: Secondary | ICD-10-CM | POA: Diagnosis not present

## 2018-12-31 DIAGNOSIS — M85859 Other specified disorders of bone density and structure, unspecified thigh: Secondary | ICD-10-CM | POA: Diagnosis not present

## 2018-12-31 DIAGNOSIS — I10 Essential (primary) hypertension: Secondary | ICD-10-CM | POA: Diagnosis not present

## 2018-12-31 DIAGNOSIS — Z0000 Encounter for general adult medical examination without abnormal findings: Secondary | ICD-10-CM | POA: Diagnosis not present

## 2018-12-31 DIAGNOSIS — Z1159 Encounter for screening for other viral diseases: Secondary | ICD-10-CM | POA: Diagnosis not present

## 2018-12-31 DIAGNOSIS — I700 Atherosclerosis of aorta: Secondary | ICD-10-CM | POA: Diagnosis not present

## 2018-12-31 DIAGNOSIS — Z23 Encounter for immunization: Secondary | ICD-10-CM | POA: Diagnosis not present

## 2019-01-01 ENCOUNTER — Ambulatory Visit: Payer: Medicare Other | Admitting: Gastroenterology

## 2019-01-03 ENCOUNTER — Telehealth: Payer: Self-pay

## 2019-01-03 NOTE — Telephone Encounter (Signed)
Patient called into the office- patient was advised to update the office with symptoms-patient reports she is feeling much better than I have and I am using the cream and the Bentyl"; the heartburn has decreased; "I really don't think there really is anything else he can do for me at this time- I am trying to limit the things that are going to bother me"; patient reports she will call back to the office if she feels it is needed; Patient advised to call back to the office at [phone] should questions/concerns arise; Patient verbalized understanding of information/instructions;

## 2019-01-03 NOTE — Telephone Encounter (Signed)
Glad she is feeling better Thanks for letting me know  RG

## 2019-01-25 ENCOUNTER — Other Ambulatory Visit: Payer: Self-pay | Admitting: Gastroenterology

## 2019-01-27 ENCOUNTER — Other Ambulatory Visit: Payer: Self-pay

## 2019-01-27 MED ORDER — DICYCLOMINE HCL 10 MG PO CAPS: 10.0000 mg | ORAL_CAPSULE | Freq: Two times a day (BID) | ORAL | 0 refills | Status: DC

## 2019-01-27 NOTE — Telephone Encounter (Signed)
Patient returned call to the office-patient reports she did not need a refills RX sent in on her Bentyl due to having 11 refills on previous RX-patient requesting to cancel RX sent in on 01/27/2019 bu MD-  [facility] and [facility] sent in on [facility]/27/2019 as the patient reports she did not need this RX- Pharmacist will refill medication as appropriate for patient to have QID PRN administration of medication as written in last OV notation by Dr. Lyndel Safe;

## 2019-01-27 NOTE — Telephone Encounter (Signed)
Left message for patient to call back to the office;  

## 2019-01-28 ENCOUNTER — Ambulatory Visit: Payer: Medicare Other | Admitting: Podiatry

## 2019-01-30 ENCOUNTER — Ambulatory Visit: Payer: Medicare Other | Admitting: Podiatry

## 2019-02-04 ENCOUNTER — Encounter: Payer: Self-pay | Admitting: Podiatry

## 2019-02-04 ENCOUNTER — Ambulatory Visit (INDEPENDENT_AMBULATORY_CARE_PROVIDER_SITE_OTHER): Payer: Medicare Other | Admitting: Podiatry

## 2019-02-04 ENCOUNTER — Other Ambulatory Visit: Payer: Self-pay

## 2019-02-04 DIAGNOSIS — G5762 Lesion of plantar nerve, left lower limb: Secondary | ICD-10-CM | POA: Diagnosis not present

## 2019-02-04 DIAGNOSIS — G5782 Other specified mononeuropathies of left lower limb: Secondary | ICD-10-CM

## 2019-02-04 DIAGNOSIS — M778 Other enthesopathies, not elsewhere classified: Secondary | ICD-10-CM | POA: Diagnosis not present

## 2019-02-04 NOTE — Progress Notes (Signed)
She presents today complaining of the second metatarsophalangeal joint of the right foot and the third interspace of the left foot.  Objective: Vital signs are stable she is alert and oriented x3 pain on palpation and end range of motion of the second metatarsophalangeal joint of the right foot is present.  She also has pain on palpation to the third interdigital space of the left foot.  Assessment: Capsulitis second metatarsophalangeal joint right neuroma third interdigital space left.  Plan: Since his been so long that I saw her last we went ahead and injected a new Kenalog into the third interdigital space of the left foot and dexamethasone to the second metatarsophalangeal joint of the right foot a total of 2 cc intra-articularly.  Follow-up with her in a few weeks.

## 2019-02-25 ENCOUNTER — Ambulatory Visit: Payer: Medicare Other | Admitting: Podiatry

## 2019-02-27 DIAGNOSIS — M25512 Pain in left shoulder: Secondary | ICD-10-CM | POA: Diagnosis not present

## 2019-02-27 DIAGNOSIS — G8929 Other chronic pain: Secondary | ICD-10-CM | POA: Diagnosis not present

## 2019-02-28 DIAGNOSIS — K581 Irritable bowel syndrome with constipation: Secondary | ICD-10-CM

## 2019-02-28 NOTE — Telephone Encounter (Signed)
Very common problem.  -Benefiber 1 TBS p.o. QD with 8 oz of water. -fluticasone cream 0.05% generic 30g 1 bid PR x 10 days, 2 refills -Sitz baths BID x 7 days. -Avoid NSAIDs. -Call if still with problems in 2 weeks.  RG

## 2019-03-03 MED ORDER — FLUTICASONE PROPIONATE 0.05 % EX CREA: TOPICAL_CREAM | Freq: Two times a day (BID) | CUTANEOUS | 2 refills | Status: AC

## 2019-03-03 NOTE — Telephone Encounter (Signed)
Patient returned call to the office-patient given instructions and is agreeable to plan of care, however, she reports she has been dong low fiber diet, using this same cream as the one being sent in (RX has been sent in per MD recommendation and patient request), and doing to the sitz baths; patient reports she will increase her fiber and increase fluids and call back to the office if symptoms worsen or do not resolve; Patient verbalized understanding of information/instructions;   Patient advised to call back to the office at [phone] should questions/concerns arise;

## 2019-03-03 NOTE — Telephone Encounter (Signed)
Left message for patient to call back to the office;  

## 2019-03-11 ENCOUNTER — Other Ambulatory Visit: Payer: Self-pay

## 2019-03-11 ENCOUNTER — Ambulatory Visit (INDEPENDENT_AMBULATORY_CARE_PROVIDER_SITE_OTHER): Payer: Medicare Other | Admitting: Podiatry

## 2019-03-11 ENCOUNTER — Encounter: Payer: Self-pay | Admitting: Podiatry

## 2019-03-11 DIAGNOSIS — M778 Other enthesopathies, not elsewhere classified: Secondary | ICD-10-CM | POA: Diagnosis not present

## 2019-03-11 NOTE — Progress Notes (Signed)
She presents today for follow-up of her capsulitis second metatarsophalangeal joint of the right foot.  States that the neuroma seem to be doing pretty well.  Objective: Vital signs are stable she is alert and oriented x3 the majority of her pain is located over the second metatarsophalangeal joint laterally of the left foot.  No open lesions or wounds are noted.  Pulses are palpable.  Assessment: She has capsulitis second metatarsophalangeal joint left foot.  Resolving neuromas and resolving capsulitis right foot.  Plan: Discussed etiology pathology conservative surgical therapies at this point I injected 10 mg of Kenalog 5 mg of Marcaine around the second metatarsophalangeal joint of the left foot.  She tolerated procedure well without complications and I will follow-up with her in the future as needed.

## 2019-03-25 ENCOUNTER — Other Ambulatory Visit: Payer: Self-pay | Admitting: Internal Medicine

## 2019-03-25 DIAGNOSIS — Z1231 Encounter for screening mammogram for malignant neoplasm of breast: Secondary | ICD-10-CM

## 2019-03-25 DIAGNOSIS — M8589 Other specified disorders of bone density and structure, multiple sites: Secondary | ICD-10-CM

## 2019-04-02 ENCOUNTER — Other Ambulatory Visit: Payer: Self-pay

## 2019-04-02 MED ORDER — DICYCLOMINE HCL 10 MG PO CAPS: 10.0000 mg | ORAL_CAPSULE | Freq: Three times a day (TID) | ORAL | 3 refills | Status: DC

## 2019-04-15 ENCOUNTER — Ambulatory Visit: Payer: Medicare Other

## 2019-04-24 ENCOUNTER — Ambulatory Visit: Payer: Medicare Other | Attending: Internal Medicine

## 2019-04-24 DIAGNOSIS — Z23 Encounter for immunization: Secondary | ICD-10-CM | POA: Insufficient documentation

## 2019-04-24 NOTE — Progress Notes (Signed)
   Covid-19 Vaccination Clinic  Name:  Ashley Brennan    MRN: [id number] DOB: 1946-02-07  04/24/2019  [name] was observed post Covid-19 immunization for 15 minutes without incidence. She was provided with Vaccine Information Sheet and instruction to access the V-Safe system.   [name] was instructed to call 911 with any severe reactions post vaccine: Marland Kitchen Difficulty breathing  . Swelling of your face and throat  . A fast heartbeat  . A bad rash all over your body  . Dizziness and weakness    Immunizations Administered    Name Date Dose VIS Date Route   Pfizer COVID-19 Vaccine 04/24/2019  4:27 PM 0.3 mL 02/28/2019 Intramuscular   Manufacturer: [place]   Lot: [id number]   [place]: [id number]

## 2019-04-30 ENCOUNTER — Ambulatory Visit: Payer: Medicare Other | Admitting: Gastroenterology

## 2019-04-30 ENCOUNTER — Encounter: Payer: Self-pay | Admitting: Gastroenterology

## 2019-04-30 ENCOUNTER — Other Ambulatory Visit: Payer: Self-pay

## 2019-04-30 VITALS — BP 126/74 | HR 72 | Temp 97.1°F | Ht 62.0 in | Wt 147.2 lb

## 2019-04-30 DIAGNOSIS — L290 Pruritus ani: Secondary | ICD-10-CM

## 2019-04-30 DIAGNOSIS — K581 Irritable bowel syndrome with constipation: Secondary | ICD-10-CM

## 2019-04-30 MED ORDER — HYDROCORTISONE ACETATE 25 MG RE SUPP: 25.0000 mg | Freq: Two times a day (BID) | RECTAL | 6 refills | Status: DC

## 2019-04-30 NOTE — Patient Instructions (Signed)
If you are age 74 or older, your body mass index should be between 23-30. Your Body mass index is 26.93 kg/m. If this is out of the aforementioned range listed, please consider follow up with your Primary Care Provider.  If you are age 75 or younger, your body mass index should be between 19-25. Your Body mass index is 26.93 kg/m. If this is out of the aformentioned range listed, please consider follow up with your Primary Care Provider.   We have sent the following medications to your pharmacy for you to pick up at your convenience: Anusol  Follow up in 12 weeks.   Thank you,  Dr. Jackquline Denmark

## 2019-04-30 NOTE — Progress Notes (Signed)
Chief Complaint: FU  Referring Provider:  Leeroy Cha,*      ASSESSMENT AND PLAN;   #1. 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 Neg colon 10/2016 except for mild left colonic div and int hoids. Random colon Bx- lymphocytic colitis.  Responded well to Bentyl and taking her off NSAIDs. Neg CT abdo/pel 12/01/2016  #2. Rectal itching likely d/t int hoids  #3. GERD with small HH on CT 11/2016.  Plan: - Bentyl 10mg  po qid prn #120, 11 refills - Anusol HC supp PR 1 bid x 10 days, 6 refills. - FU in 12 weeks.  If still has problems or he starts having recurrent diarrhea, will give her a trial of budesonide.  HPI:    Zisel Lovelace is a 74 y.o. female   Here for prescription refill for Bentyl.  Going to [place] towards March and to visit her daughter.  Also going to visit her mother in [place] who is in nursing home.  Still with occasional rectal itching or burning especially when she wipes.  She is having frequent but normal bowel movements 3/day.  No nausea, vomiting, regurgitation, odynophagia or dysphagia.  No significant diarrhea or constipation.  There is no melena or hematochezia. No unintentional weight loss.  Denies having any abdominal pain.  Awaiting epidural shots in the cervical spine  Still takes magnesium on as needed basis.   Past Medical History:  Diagnosis Date  . Arthritis    Stated she get shots in her hands and feet  . Asthma   . Back pain   . Diverticulosis   . Fecal incontinence   . Flatulence   . GERD (gastroesophageal reflux disease)   . History of colon polyps   . HTN (hypertension)   . Hyperlipidemia   . IBS (irritable bowel syndrome)   . Seasonal allergies   . Skin cancer    Squamous cell    Past Surgical History:  Procedure Laterality Date  . ABDOMINAL HYSTERECTOMY    . APPENDECTOMY    . BACK SURGERY    . CATARACT EXTRACTION, BILATERAL    . COLONOSCOPY  11/03/2016   Diverticulosis of colon. Internal hemorrhoids  .  FOOT SURGERY Right    said they put her joint in her foot. [facility]  . KNEE SURGERY      Family History  Problem Relation Age of Onset  . Prostate cancer Father   . Lung cancer Father   . Colon cancer Neg Hx   . Esophageal cancer Neg Hx     Social History   Tobacco Use  . Smoking status: Former Research scientist (life sciences)  . Smokeless tobacco: Never Used  Substance Use Topics  . Alcohol use: Yes    Comment: glass of wine with tonic  . Drug use: Not Currently    Current Outpatient Medications  Medication Sig Dispense Refill  . Acetaminophen (TYLENOL PO) Take 1 tablet by mouth as needed.    Marland Kitchen atorvastatin (LIPITOR) 20 MG tablet Take 20 mg by mouth daily.    . Calcium Carbonate Antacid (TUMS PO) Take by mouth daily.    Marland Kitchen dicyclomine (BENTYL) 10 MG capsule Take 1 capsule (10 mg total) by mouth 4 (four) times daily -  before meals and at bedtime. 360 capsule 3  . fluticasone (CUTIVATE) 0.05 % cream Apply topically 2 (two) times daily. X 10 days 30 g 2  . KRILL OIL PO Take 1 tablet by mouth daily.    Marland Kitchen LOSARTAN POTASSIUM PO Take 1  tablet by mouth daily. Says she takes a 5mg     . Polyethyl Glycol-Propyl Glycol (SYSTANE) 0.4-0.3 % GEL ophthalmic gel Apply 1 application to eye as needed.     . verapamil (CALAN-SR) 120 MG CR tablet Take 120 mg by mouth daily.     . clindamycin (CLEOCIN) 300 MG capsule TK ONE C PO  QID TAT    . COLLAGEN PO Take by mouth daily.     No current facility-administered medications for this visit.    No Known Allergies  Review of Systems:  neg     Physical Exam:    BP 126/74   Pulse 72   Temp (!) 97.1 F (36.2 C)   Ht 5\' 2"  (1.575 m)   Wt 147 lb 4 oz (66.8 kg)   BMI 26.93 kg/m  Filed Weights   04/30/19 1451  Weight: 147 lb 4 oz (66.8 kg)   Constitutional:  Well-developed, in no acute distress. Psychiatric: Normal mood and affect. Behavior is normal. HEENT: Pupils normal.  Conjunctivae are normal. No scleral icterus. Neck supple.  Cardiovascular: Normal  rate, regular rhythm. No edema Pulmonary/chest: Effort normal and breath sounds normal. No wheezing, rales or rhonchi. Abdominal: Soft, nondistended. Nontender. Bowel sounds active throughout. There are no masses palpable. No hepatomegaly. Rectal: No further perirectal excoriation.  She does have a small nternal hemorrhoids, brown heme-negative stools.  Good rectal tone. Seen in presence of Brooke CMA Neurological: Alert and oriented to person place and time. Skin: Skin is warm and dry. No rashes noted.  Data Reviewed: I have personally reviewed following labs and imaging studies  CBC: CBC Latest Ref Rng & Units 08/19/2009  WBC 4.0 - 10.5 K/uL 6.1  Hemoglobin 12.0 - 15.0 g/dL 14.8  Hematocrit 36.0 - 46.0 % 43.2  Platelets 150 - 400 K/uL 325    CMP: CMP Latest Ref Rng & Units 08/19/2009  Glucose 70 - 99 mg/dL 99  BUN 6 - 23 mg/dL 11  Creatinine 0.4 - 1.2 mg/dL 1.10  Sodium 135 - 145 mEq/L 140  Potassium 3.5 - 5.1 mEq/L 3.8  Chloride 96 - 112 mEq/L 100  CO2 19 - 32 mEq/L 33(H)  Calcium 8.4 - 10.5 mg/dL 9.5  I spent 25 minutes of face-to-face time with the patient. Greater than 50% of the time was spent counseling and coordinating care.     Carmell Austria, MD 04/30/2019, 3:11 PM  Cc: Leeroy Cha,*

## 2019-05-02 ENCOUNTER — Ambulatory Visit: Payer: Medicare Other

## 2019-05-19 ENCOUNTER — Ambulatory Visit: Payer: Medicare Other | Attending: Internal Medicine

## 2019-05-19 ENCOUNTER — Ambulatory Visit: Payer: Medicare Other

## 2019-05-19 DIAGNOSIS — Z23 Encounter for immunization: Secondary | ICD-10-CM

## 2019-05-19 NOTE — Progress Notes (Signed)
   Covid-19 Vaccination Clinic  Name:  [name]    MRN: [id number] DOB: 1945-08-23  05/19/2019  [name] was observed post Covid-19 immunization for 15 minutes without incidence. She was provided with Vaccine Information Sheet and instruction to access the V-Safe system.   [name] was instructed to call 911 with any severe reactions post vaccine: Marland Kitchen Difficulty breathing  . Swelling of your face and throat  . A fast heartbeat  . A bad rash all over your body  . Dizziness and weakness    Immunizations Administered    Name Date Dose VIS Date Route   Pfizer COVID-19 Vaccine 05/19/2019  3:11 PM 0.3 mL 02/28/2019 Intramuscular   Manufacturer: [place]   Lot: [id number]   [place]: [id number]

## 2019-05-29 ENCOUNTER — Other Ambulatory Visit: Payer: Self-pay

## 2019-05-29 ENCOUNTER — Encounter: Payer: Self-pay | Admitting: Podiatry

## 2019-05-29 ENCOUNTER — Ambulatory Visit: Payer: Medicare Other | Admitting: Podiatry

## 2019-05-29 DIAGNOSIS — G5782 Other specified mononeuropathies of left lower limb: Secondary | ICD-10-CM

## 2019-05-29 DIAGNOSIS — M778 Other enthesopathies, not elsewhere classified: Secondary | ICD-10-CM | POA: Diagnosis not present

## 2019-05-29 DIAGNOSIS — G5762 Lesion of plantar nerve, left lower limb: Secondary | ICD-10-CM | POA: Diagnosis not present

## 2019-05-29 NOTE — Progress Notes (Signed)
She presents today for follow-up of her neuroma third interdigital space of the left foot.  And pain about the second metatarsophalangeal joint of the left foot.  Objective: Palpable neuroma third interdigital space left pain on end range of motion of the second metatarsophalangeal joint left.  Assessment: Neuroma third interdigital space left capsulitis second metatarsophalangeal joint left.  Plan: Injected 10 mg of Kenalog 5 mg Marcaine point of maximal tenderness into the second metatarsophalangeal joint.  Also injected dehydrated alcohol to the third interspace of the left foot.  Patient states that she might like to consider surgery when she is back near the end of summer.

## 2019-06-06 ENCOUNTER — Ambulatory Visit
Admission: RE | Admit: 2019-06-06 | Discharge: 2019-06-06 | Disposition: A | Discharge status: Home / Self Care | Payer: Medicare Other | Source: Ambulatory Visit | Attending: Internal Medicine | Admitting: Internal Medicine

## 2019-06-06 ENCOUNTER — Other Ambulatory Visit: Payer: Self-pay

## 2019-06-06 DIAGNOSIS — M8589 Other specified disorders of bone density and structure, multiple sites: Secondary | ICD-10-CM

## 2019-06-06 DIAGNOSIS — Z1231 Encounter for screening mammogram for malignant neoplasm of breast: Secondary | ICD-10-CM

## 2019-11-13 ENCOUNTER — Ambulatory Visit: Payer: Medicare Other | Admitting: Podiatry

## 2019-11-13 ENCOUNTER — Other Ambulatory Visit: Payer: Self-pay

## 2019-11-13 ENCOUNTER — Encounter: Payer: Self-pay | Admitting: Podiatry

## 2019-11-13 DIAGNOSIS — G5762 Lesion of plantar nerve, left lower limb: Secondary | ICD-10-CM

## 2019-11-13 DIAGNOSIS — G5761 Lesion of plantar nerve, right lower limb: Secondary | ICD-10-CM | POA: Diagnosis not present

## 2019-11-13 DIAGNOSIS — G5782 Other specified mononeuropathies of left lower limb: Secondary | ICD-10-CM

## 2019-11-13 MED ORDER — CYCLOBENZAPRINE HCL 10 MG PO TABS: 10.0000 mg | ORAL_TABLET | Freq: Three times a day (TID) | ORAL | 0 refills | Status: DC | PRN

## 2019-11-13 NOTE — Progress Notes (Signed)
Presents today for follow-up of her neuroma third interspace of her left foot and second interspace of the right foot.  States that they have been cramping at night and causing her a lot of pain states that she hit her foot on just beneath the third knuckle the other day which caused a lot of pain for period of time.  Objective: Vital signs are stable she alert oriented x3 pulses are palpable palpable Mulder's click third interspace left less degree on the right second interdigital space.  Assessment: Neuroma third interspace left second interspace right.  Plan: At this point because of the acute sensation in the left foot I decided to put 10 mg of Kenalog 5 mg Marcaine to the right side as well as the left side.  I will follow-up with her in 1 month or so.

## 2019-12-09 ENCOUNTER — Other Ambulatory Visit: Payer: Self-pay

## 2019-12-09 ENCOUNTER — Encounter: Payer: Self-pay | Admitting: Physician Assistant

## 2019-12-09 ENCOUNTER — Ambulatory Visit: Payer: Medicare Other | Admitting: Physician Assistant

## 2019-12-09 DIAGNOSIS — D0471 Carcinoma in situ of skin of right lower limb, including hip: Secondary | ICD-10-CM

## 2019-12-09 DIAGNOSIS — L570 Actinic keratosis: Secondary | ICD-10-CM

## 2019-12-09 DIAGNOSIS — Z1283 Encounter for screening for malignant neoplasm of skin: Secondary | ICD-10-CM | POA: Diagnosis not present

## 2019-12-09 DIAGNOSIS — D485 Neoplasm of uncertain behavior of skin: Secondary | ICD-10-CM

## 2019-12-09 DIAGNOSIS — D045 Carcinoma in situ of skin of trunk: Secondary | ICD-10-CM | POA: Diagnosis not present

## 2019-12-09 DIAGNOSIS — Z85828 Personal history of other malignant neoplasm of skin: Secondary | ICD-10-CM | POA: Diagnosis not present

## 2019-12-09 NOTE — Patient Instructions (Signed)

## 2019-12-10 NOTE — Progress Notes (Signed)
Follow-Up Visit   Subjective  Ashley Brennan is a 74 y.o. female who presents for the following: Follow-up (RIGHT FORHEAD RED SPOT X MONTHS, CHECK SCALP, CHEST RED SPOT, RIGHT SHIN).   The following portions of the chart were reviewed this encounter and updated as appropriate: Tobacco  Allergies  Meds  Problems  Med Hx  Surg Hx  Fam Hx      Objective  Well appearing patient in no apparent distress; mood and affect are within normal limits.  A focused examination was performed including waist up and legs. Relevant physical exam findings are noted in the Assessment and Plan.  Objective  Head - Anterior (Face), Right Forearm - Posterior: All scars clear  Objective  Head to toe: No atypical nevi .No signs of non-mole skin cancer.   Objective  Mid Tip of Nose: Hyperkeratotic scale with pink base  0.3 cm     Objective  Right Forehead: Hyperkeratotic scale with pink base  0.6cm     Objective  Right Lower Leg - Anterior: Hyperkeratotic scale with pink base  0.4cm     Objective  Chest - Medial [facility]): Hyperkeratotic scale with pink base  0.9cm      Objective  Left Forearm - Anterior (4), Right Elbow - Posterior: Erythematous patches with gritty scale.   Assessment & Plan  History of SCC (squamous cell carcinoma) of skin (2) Head - Anterior (Face); Right Forearm - Posterior  observe  Screening exam for skin cancer Head to toe  Skin exams  Neoplasm of uncertain behavior of skin (4) Mid Tip of Nose  Epidermal / dermal shaving  Lesion diameter (cm):  0.6 Informed consent: discussed and consent obtained   Timeout: patient name, date of birth, surgical site, and procedure verified   Procedure prep:  Patient was prepped and draped in usual sterile fashion Prep type:  Chlorhexidine Anesthesia: the lesion was anesthetized in a standard fashion   Anesthetic:  1% lidocaine w/ epinephrine 1-100,000 local infiltration Instrument used: DermaBlade    Hemostasis achieved with: aluminum chloride   Outcome: patient tolerated procedure well   Post-procedure details: sterile dressing applied and wound care instructions given   Dressing type: petrolatum gauze, petrolatum and bandage    Specimen 1 - Surgical pathology Differential Diagnosis: bcc vs scc Check Margins: yes  Right Forehead  Epidermal / dermal shaving  Lesion diameter (cm):  1 Informed consent: discussed and consent obtained   Timeout: patient name, date of birth, surgical site, and procedure verified   Procedure prep:  Patient was prepped and draped in usual sterile fashion Prep type:  Chlorhexidine Anesthesia: the lesion was anesthetized in a standard fashion   Anesthetic:  1% lidocaine w/ epinephrine 1-100,000 local infiltration Instrument used: DermaBlade   Hemostasis achieved with: aluminum chloride   Outcome: patient tolerated procedure well   Post-procedure details: sterile dressing applied and wound care instructions given   Dressing type: petrolatum gauze, petrolatum and bandage    Specimen 2 - Surgical pathology Differential Diagnosis: bcc vs scc Check Margins: yes  Right Lower Leg - Anterior  Epidermal / dermal shaving  Lesion diameter (cm):  0.9 Informed consent: discussed and consent obtained   Timeout: patient name, date of birth, surgical site, and procedure verified   Procedure prep:  Patient was prepped and draped in usual sterile fashion Prep type:  Chlorhexidine Anesthesia: the lesion was anesthetized in a standard fashion   Anesthetic:  1% lidocaine w/ epinephrine 1-100,000 local infiltration Instrument used: DermaBlade  Hemostasis achieved with: aluminum chloride   Outcome: patient tolerated procedure well   Post-procedure details: sterile dressing applied and wound care instructions given   Dressing type: petrolatum gauze, petrolatum and bandage    Specimen 3 - Surgical pathology Differential Diagnosis: bcc vs scc Check Margins:  yes  Chest - Medial (Center)  Epidermal / dermal shaving  Lesion diameter (cm):  1.2 Informed consent: discussed and consent obtained   Timeout: patient name, date of birth, surgical site, and procedure verified   Procedure prep:  Patient was prepped and draped in usual sterile fashion Prep type:  Chlorhexidine Anesthesia: the lesion was anesthetized in a standard fashion   Anesthetic:  1% lidocaine w/ epinephrine 1-100,000 local infiltration Instrument used: DermaBlade   Hemostasis achieved with: aluminum chloride   Outcome: patient tolerated procedure well   Post-procedure details: sterile dressing applied and wound care instructions given   Dressing type: petrolatum gauze, petrolatum and bandage    Specimen 4 - Surgical pathology Differential Diagnosis: bcc vs scc Check Margins: yes  AK (actinic keratosis) (5) Right Elbow - Posterior; Left Forearm - Anterior (4)  Destruction of lesion - Left Forearm - Anterior, Right Elbow - Posterior Complexity: simple   Destruction method: cryotherapy   Informed consent: discussed and consent obtained   Timeout:  patient name, date of birth, surgical site, and procedure verified Lesion destroyed using liquid nitrogen: Yes   Cryotherapy cycles:  1 Outcome: patient tolerated procedure well with no complications   Post-procedure details: wound care instructions given      I, Arye Weyenberg, PA-C, have reviewed all documentation's for this visit.  The documentation on 12/10/19 for the exam, diagnosis, procedures and orders are all accurate and complete.

## 2019-12-15 ENCOUNTER — Telehealth: Payer: Self-pay | Admitting: Physician Assistant

## 2019-12-15 ENCOUNTER — Encounter: Payer: Self-pay | Admitting: Physician Assistant

## 2019-12-15 NOTE — Telephone Encounter (Signed)
Pathology to patient, surgery scheduled with [facility] per patient request.

## 2019-12-15 NOTE — Telephone Encounter (Signed)
Patient calling for biopsy results

## 2019-12-15 NOTE — Telephone Encounter (Signed)
-----   Message from Warren Danes, [place] sent at 12/15/2019  9:41 AM EDT ----- WS #2 ok with st or JCB if not 3 mo ok if she is ok. Will watch [place] for recurrence.

## 2019-12-31 ENCOUNTER — Other Ambulatory Visit: Payer: Self-pay | Admitting: Gastroenterology

## 2019-12-31 MED ORDER — HYDROCORTISONE ACETATE 25 MG RE SUPP: 25.0000 mg | Freq: Two times a day (BID) | RECTAL | 6 refills | Status: DC

## 2020-02-17 ENCOUNTER — Other Ambulatory Visit: Payer: Self-pay

## 2020-02-17 ENCOUNTER — Ambulatory Visit: Payer: Medicare Other | Admitting: Podiatry

## 2020-02-17 ENCOUNTER — Encounter: Payer: Self-pay | Admitting: Podiatry

## 2020-02-17 DIAGNOSIS — G5782 Other specified mononeuropathies of left lower limb: Secondary | ICD-10-CM

## 2020-02-17 DIAGNOSIS — G5762 Lesion of plantar nerve, left lower limb: Secondary | ICD-10-CM | POA: Diagnosis not present

## 2020-02-17 NOTE — Progress Notes (Signed)
She presents today states that I will have this neuroma removed we have been fighting this thing for over 2 years now and I like to get rid of it.  Starting to affect my ability for my daily activities and for me to enjoy life.  Objective: Vital signs are stable she is alert oriented x3 reviewed her past medical history medications allergies surgery and social history.  There are no changes.  Ulcers are strongly palpable left foot.  Palpable Mulder's click third interdigital space of the left foot with large mass.  Assessment: Chronic neuroma third interspace left foot.  Plan: Schedule her for a surgical neurectomy third interdigital space of the left foot.  Discussed possible side effects which may include but not limited to postop pain bleeding swelling infection recurrence need for further surgery overcorrection under correction loss of digit loss of limb loss of life.  Provide her with information regarding the surgery center anesthesia group and instructions for the morning of surgery.  She already has a cam boot at home she will follow up with [place] in the near future for surgery.

## 2020-03-01 ENCOUNTER — Telehealth: Payer: Self-pay | Admitting: Podiatry

## 2020-03-01 NOTE — Telephone Encounter (Signed)
DOS: 03/26/2020  Procedure: Neurectomy 3rd Interspace Lt (28080)  Notification or Prior Authorization is not required for the requested services  This [employer] Medicare Advantage members plan does not currently require a prior authorization for these services. If you have general questions about the prior authorization requirements, please call us at [phone] or visit [email or web address] > Clinician Resources > Advance and Admission Notification Requirements. The number above acknowledges your notification. Please write this number down for future reference. Notification is not a guarantee of coverage or payment.  Decision ID #:[id number]  The number above acknowledges your inquiry and our response. Please write this number down and refer to it for future inquiries. Coverage and payment for an item or service is governed by the member's benefit plan document, and, if applicable, the provider's participation agreement with the Health Plan.

## 2020-03-02 ENCOUNTER — Encounter: Payer: Self-pay | Admitting: Podiatry

## 2020-03-03 ENCOUNTER — Other Ambulatory Visit: Payer: Self-pay | Admitting: Podiatry

## 2020-03-03 DIAGNOSIS — G5782 Other specified mononeuropathies of left lower limb: Secondary | ICD-10-CM

## 2020-03-03 DIAGNOSIS — M778 Other enthesopathies, not elsewhere classified: Secondary | ICD-10-CM

## 2020-03-24 ENCOUNTER — Other Ambulatory Visit: Payer: Self-pay | Admitting: Podiatry

## 2020-03-24 ENCOUNTER — Other Ambulatory Visit: Payer: Self-pay

## 2020-03-24 ENCOUNTER — Ambulatory Visit
Admission: RE | Admit: 2020-03-24 | Discharge: 2020-03-24 | Disposition: A | Discharge status: Home / Self Care | Payer: Medicare Other | Source: Ambulatory Visit | Attending: Podiatry | Admitting: Podiatry

## 2020-03-24 DIAGNOSIS — M778 Other enthesopathies, not elsewhere classified: Secondary | ICD-10-CM

## 2020-03-24 DIAGNOSIS — G5762 Lesion of plantar nerve, left lower limb: Secondary | ICD-10-CM

## 2020-03-24 DIAGNOSIS — G5782 Other specified mononeuropathies of left lower limb: Secondary | ICD-10-CM

## 2020-03-24 DIAGNOSIS — M19072 Primary osteoarthritis, left ankle and foot: Secondary | ICD-10-CM | POA: Diagnosis not present

## 2020-03-24 MED ORDER — ONDANSETRON HCL 4 MG PO TABS: 4.0000 mg | ORAL_TABLET | Freq: Three times a day (TID) | ORAL | 0 refills | Status: DC | PRN

## 2020-03-24 MED ORDER — CEPHALEXIN 500 MG PO CAPS: 500.0000 mg | ORAL_CAPSULE | Freq: Three times a day (TID) | ORAL | 0 refills | Status: DC

## 2020-03-24 MED ORDER — HYDROCODONE-ACETAMINOPHEN 10-325 MG PO TABS: 1.0000 | ORAL_TABLET | Freq: Four times a day (QID) | ORAL | 0 refills | Status: AC | PRN

## 2020-03-25 ENCOUNTER — Encounter: Payer: Medicare Other | Admitting: Dermatology

## 2020-03-26 DIAGNOSIS — G5762 Lesion of plantar nerve, left lower limb: Secondary | ICD-10-CM

## 2020-03-26 DIAGNOSIS — M25572 Pain in left ankle and joints of left foot: Secondary | ICD-10-CM | POA: Diagnosis not present

## 2020-03-29 ENCOUNTER — Telehealth: Payer: Self-pay

## 2020-03-29 NOTE — Telephone Encounter (Signed)
Patient has been notified of mri results

## 2020-03-29 NOTE — Telephone Encounter (Signed)
-----   Message from Garrel Ridgel, [place] sent at 03/24/2020 12:08 PM EST ----- Just a neuroma and some sesamoiditis

## 2020-04-02 ENCOUNTER — Other Ambulatory Visit: Payer: Self-pay

## 2020-04-02 ENCOUNTER — Ambulatory Visit (INDEPENDENT_AMBULATORY_CARE_PROVIDER_SITE_OTHER): Payer: Medicare Other | Admitting: Dermatology

## 2020-04-02 ENCOUNTER — Encounter: Payer: Self-pay | Admitting: Dermatology

## 2020-04-02 DIAGNOSIS — C44722 Squamous cell carcinoma of skin of right lower limb, including hip: Secondary | ICD-10-CM

## 2020-04-02 DIAGNOSIS — C4492 Squamous cell carcinoma of skin, unspecified: Secondary | ICD-10-CM

## 2020-04-02 NOTE — Patient Instructions (Signed)

## 2020-04-06 ENCOUNTER — Ambulatory Visit (INDEPENDENT_AMBULATORY_CARE_PROVIDER_SITE_OTHER): Payer: Medicare Other | Admitting: Podiatry

## 2020-04-06 ENCOUNTER — Encounter: Payer: Self-pay | Admitting: Dermatology

## 2020-04-06 ENCOUNTER — Other Ambulatory Visit: Payer: Self-pay

## 2020-04-06 DIAGNOSIS — G5782 Other specified mononeuropathies of left lower limb: Secondary | ICD-10-CM

## 2020-04-06 DIAGNOSIS — G5762 Lesion of plantar nerve, left lower limb: Secondary | ICD-10-CM | POA: Diagnosis not present

## 2020-04-06 DIAGNOSIS — Z09 Encounter for follow-up examination after completed treatment for conditions other than malignant neoplasm: Secondary | ICD-10-CM

## 2020-04-06 NOTE — Progress Notes (Signed)
She presents today stating that she is tired of sitting on her butt and having to take bird baths.  Date of surgery March 26, 2020 neurectomy third interdigital space left foot.  Objective: Vital signs are stable alert and oriented x3.  Pulses are palpable.  There is no erythema edema cellulitis drainage or odor sutures are intact margins well coapted there is just some mild dehiscence but this should not leave any problems in the future.  Assessment: Well-healing surgical foot.  Plan: We will leave the stitches for another week follow-up with her at that time for suture removal.  Dispensed a Darco shoe.

## 2020-04-12 NOTE — Progress Notes (Signed)
   Follow-Up Visit   Subjective  Ashley Brennan is a 75 y.o. female who presents for the following: Procedure (CIS x 2 right lower leg-anterior and chest medial (center)).  Skin cancer Location: Right lower leg Duration:  Quality:  Associated Signs/Symptoms: Modifying Factors:  Severity:  Timing: Context: For treatment  Objective  Well appearing patient in no apparent distress; mood and affect are within normal limits. Objective  Right Lower Leg - Anterior: Biopsy site identified by patient, nurse, and me.   A focused examination was performed including Legs.. Relevant physical exam findings are noted in the Assessment and Plan.   Assessment & Plan    Squamous cell carcinoma of skin Right Lower Leg - Anterior  Destruction of lesion Complexity: simple   Destruction method: electrodesiccation and curettage   Informed consent: discussed and consent obtained   Timeout:  patient name, date of birth, surgical site, and procedure verified Anesthesia: the lesion was anesthetized in a standard fashion   Anesthetic:  1% lidocaine w/ epinephrine 1-100,000 local infiltration Curettage performed in three different directions: Yes   Curettage cycles:  3 Lesion length (cm):  1.5 Lesion width (cm):  1.5 Margin per side (cm):  0 Final wound size (cm):  1.5 Hemostasis achieved with:  ferric subsulfate Outcome: patient tolerated procedure well with no complications   Additional details:  Wound innoculated with 5 fluorouracil solution.     I, Lavonna Monarch, MD, have reviewed all documentation for this visit.  The documentation on 04/12/20 for the exam, diagnosis, procedures, and orders are all accurate and complete.

## 2020-04-13 ENCOUNTER — Other Ambulatory Visit: Payer: Self-pay

## 2020-04-13 ENCOUNTER — Ambulatory Visit (INDEPENDENT_AMBULATORY_CARE_PROVIDER_SITE_OTHER): Payer: Medicare Other | Admitting: Podiatry

## 2020-04-13 ENCOUNTER — Encounter: Payer: Self-pay | Admitting: Podiatry

## 2020-04-13 DIAGNOSIS — Z09 Encounter for follow-up examination after completed treatment for conditions other than malignant neoplasm: Secondary | ICD-10-CM

## 2020-04-13 DIAGNOSIS — G5782 Other specified mononeuropathies of left lower limb: Secondary | ICD-10-CM

## 2020-04-13 DIAGNOSIS — G5762 Lesion of plantar nerve, left lower limb: Secondary | ICD-10-CM

## 2020-04-13 NOTE — Progress Notes (Signed)
She presents today little more than 2 weeks status post excision neuroma third interspace of her left foot.  States that it is mildly tender and sore states that she went to the gym today and has been wearing regular shoes.  She states that the Darco shoe was painful.  Objective: Vital signs are stable alert oriented times 3 sutures are intact margins are mostly coapted proximal edge of the margin may have some serosanguineous type fluid on coming from it but it does not appear to be dehiscing.  Assessment: Well-healing excision neuroma third left.  Plan: Place Steri-Strips today after sutures were removed let her start getting this wet the next couple days and allow her to get back into her regular shoe gear and I will follow-up with her in 2 weeks

## 2020-04-14 ENCOUNTER — Encounter: Payer: Self-pay | Admitting: Podiatry

## 2020-04-15 ENCOUNTER — Encounter: Payer: Medicare Other | Admitting: Physician Assistant

## 2020-04-19 DIAGNOSIS — E785 Hyperlipidemia, unspecified: Secondary | ICD-10-CM | POA: Diagnosis not present

## 2020-04-19 DIAGNOSIS — I10 Essential (primary) hypertension: Secondary | ICD-10-CM | POA: Diagnosis not present

## 2020-04-19 DIAGNOSIS — M19042 Primary osteoarthritis, left hand: Secondary | ICD-10-CM | POA: Diagnosis not present

## 2020-04-19 DIAGNOSIS — M19041 Primary osteoarthritis, right hand: Secondary | ICD-10-CM | POA: Diagnosis not present

## 2020-04-27 ENCOUNTER — Encounter: Payer: Self-pay | Admitting: Podiatry

## 2020-04-27 ENCOUNTER — Ambulatory Visit (INDEPENDENT_AMBULATORY_CARE_PROVIDER_SITE_OTHER): Payer: Medicare Other | Admitting: Podiatry

## 2020-04-27 ENCOUNTER — Other Ambulatory Visit: Payer: Self-pay

## 2020-04-27 DIAGNOSIS — G5782 Other specified mononeuropathies of left lower limb: Secondary | ICD-10-CM

## 2020-04-27 DIAGNOSIS — Z09 Encounter for follow-up examination after completed treatment for conditions other than malignant neoplasm: Secondary | ICD-10-CM

## 2020-04-27 DIAGNOSIS — G5762 Lesion of plantar nerve, left lower limb: Secondary | ICD-10-CM

## 2020-04-28 NOTE — Progress Notes (Signed)
She presents today for follow-up of her neurectomy 03/26/2020 third interdigital space left foot.  States is still swollen feels like walking on a rock and I get sharp pains in the area occasionally.  She denies fever chills nausea vomiting muscle aches pains.  Objective: Vital signs are stable she alert oriented x3.  Pulses are palpable.  Surgical site appears to be healing very nicely scab is intact she has 2 Steri-Strips over the area.  There is no purulence no malodor it is moderately edematous tender on palpation particularly plantarly  Assessment: Well-healing surgical foot left.  Plan: Recommend she get back to her regular activities and perform a massage therapy to the surgical site.  She understands this is amendable to it I will follow-up with her in a few weeks.

## 2020-04-30 DIAGNOSIS — M19041 Primary osteoarthritis, right hand: Secondary | ICD-10-CM | POA: Diagnosis not present

## 2020-04-30 DIAGNOSIS — I10 Essential (primary) hypertension: Secondary | ICD-10-CM | POA: Diagnosis not present

## 2020-04-30 DIAGNOSIS — M19042 Primary osteoarthritis, left hand: Secondary | ICD-10-CM | POA: Diagnosis not present

## 2020-04-30 DIAGNOSIS — E785 Hyperlipidemia, unspecified: Secondary | ICD-10-CM | POA: Diagnosis not present

## 2020-05-11 ENCOUNTER — Encounter: Payer: Medicare Other | Admitting: Podiatry

## 2020-05-25 ENCOUNTER — Ambulatory Visit (INDEPENDENT_AMBULATORY_CARE_PROVIDER_SITE_OTHER): Payer: Medicare Other | Admitting: Podiatry

## 2020-05-25 ENCOUNTER — Encounter: Payer: Self-pay | Admitting: Podiatry

## 2020-05-25 ENCOUNTER — Other Ambulatory Visit: Payer: Self-pay

## 2020-05-25 DIAGNOSIS — G5762 Lesion of plantar nerve, left lower limb: Secondary | ICD-10-CM

## 2020-05-25 DIAGNOSIS — Z09 Encounter for follow-up examination after completed treatment for conditions other than malignant neoplasm: Secondary | ICD-10-CM

## 2020-05-25 DIAGNOSIS — G5782 Other specified mononeuropathies of left lower limb: Secondary | ICD-10-CM

## 2020-05-25 MED ORDER — METHYLPREDNISOLONE 4 MG PO TBPK: ORAL_TABLET | ORAL | 0 refills | Status: DC

## 2020-05-25 MED ORDER — CELECOXIB 100 MG PO CAPS: 100.0000 mg | ORAL_CAPSULE | Freq: Two times a day (BID) | ORAL | 3 refills | Status: DC

## 2020-05-26 DIAGNOSIS — D2262 Melanocytic nevi of left upper limb, including shoulder: Secondary | ICD-10-CM | POA: Diagnosis not present

## 2020-05-26 DIAGNOSIS — D2371 Other benign neoplasm of skin of right lower limb, including hip: Secondary | ICD-10-CM | POA: Diagnosis not present

## 2020-05-26 DIAGNOSIS — L814 Other melanin hyperpigmentation: Secondary | ICD-10-CM | POA: Diagnosis not present

## 2020-05-26 DIAGNOSIS — L821 Other seborrheic keratosis: Secondary | ICD-10-CM | POA: Diagnosis not present

## 2020-05-26 DIAGNOSIS — Z85828 Personal history of other malignant neoplasm of skin: Secondary | ICD-10-CM | POA: Diagnosis not present

## 2020-05-26 DIAGNOSIS — L570 Actinic keratosis: Secondary | ICD-10-CM | POA: Diagnosis not present

## 2020-05-26 DIAGNOSIS — D1801 Hemangioma of skin and subcutaneous tissue: Secondary | ICD-10-CM | POA: Diagnosis not present

## 2020-05-26 DIAGNOSIS — D225 Melanocytic nevi of trunk: Secondary | ICD-10-CM | POA: Diagnosis not present

## 2020-05-26 NOTE — Progress Notes (Signed)
She presents today date of surgery 03/26/2020 with a neurectomy third interdigital space of the left foot.  States that she is gets sharp shooting pains across the dorsal aspect of the left foot.  She states that she had to be on her feet a whole lot more recently because of the death of her mother and having to travel to [place] in [place].  Objective: Vital signs are stable oriented x3 mild edema overlying the dorsal lateral aspect of the left foot.  No other pain other than the third interdigital space on palpation left.  There is no signs of infection.  No open lesions or wounds.  Assessment: Well healing neuroma excision  Plan: At this point due to the inflammation and the soreness I am going to go ahead and start her on a Medrol Dosepak should she have questions or surgery notify us immediately.

## 2020-06-01 DIAGNOSIS — M180 Bilateral primary osteoarthritis of first carpometacarpal joints: Secondary | ICD-10-CM | POA: Diagnosis not present

## 2020-06-03 DIAGNOSIS — H26493 Other secondary cataract, bilateral: Secondary | ICD-10-CM | POA: Diagnosis not present

## 2020-06-03 DIAGNOSIS — Z961 Presence of intraocular lens: Secondary | ICD-10-CM | POA: Diagnosis not present

## 2020-06-03 DIAGNOSIS — H35371 Puckering of macula, right eye: Secondary | ICD-10-CM | POA: Diagnosis not present

## 2020-06-03 DIAGNOSIS — H52203 Unspecified astigmatism, bilateral: Secondary | ICD-10-CM | POA: Diagnosis not present

## 2020-06-03 DIAGNOSIS — M47812 Spondylosis without myelopathy or radiculopathy, cervical region: Secondary | ICD-10-CM | POA: Diagnosis not present

## 2020-06-11 ENCOUNTER — Other Ambulatory Visit: Payer: Self-pay

## 2020-06-11 DIAGNOSIS — Z1211 Encounter for screening for malignant neoplasm of colon: Secondary | ICD-10-CM | POA: Diagnosis not present

## 2020-06-11 DIAGNOSIS — Z1231 Encounter for screening mammogram for malignant neoplasm of breast: Secondary | ICD-10-CM | POA: Diagnosis not present

## 2020-06-11 DIAGNOSIS — Z0000 Encounter for general adult medical examination without abnormal findings: Secondary | ICD-10-CM | POA: Diagnosis not present

## 2020-06-11 DIAGNOSIS — M8588 Other specified disorders of bone density and structure, other site: Secondary | ICD-10-CM | POA: Diagnosis not present

## 2020-06-11 DIAGNOSIS — I10 Essential (primary) hypertension: Secondary | ICD-10-CM | POA: Diagnosis not present

## 2020-06-11 DIAGNOSIS — E785 Hyperlipidemia, unspecified: Secondary | ICD-10-CM | POA: Diagnosis not present

## 2020-06-11 MED ORDER — DICYCLOMINE HCL 10 MG PO CAPS: 10.0000 mg | ORAL_CAPSULE | Freq: Three times a day (TID) | ORAL | 0 refills | Status: DC

## 2020-06-14 ENCOUNTER — Other Ambulatory Visit: Payer: Self-pay | Admitting: Internal Medicine

## 2020-06-14 DIAGNOSIS — M9904 Segmental and somatic dysfunction of sacral region: Secondary | ICD-10-CM | POA: Diagnosis not present

## 2020-06-14 DIAGNOSIS — M9903 Segmental and somatic dysfunction of lumbar region: Secondary | ICD-10-CM | POA: Diagnosis not present

## 2020-06-14 DIAGNOSIS — M8580 Other specified disorders of bone density and structure, unspecified site: Secondary | ICD-10-CM

## 2020-06-14 DIAGNOSIS — M9905 Segmental and somatic dysfunction of pelvic region: Secondary | ICD-10-CM | POA: Diagnosis not present

## 2020-06-14 DIAGNOSIS — Z1231 Encounter for screening mammogram for malignant neoplasm of breast: Secondary | ICD-10-CM

## 2020-06-14 DIAGNOSIS — M5136 Other intervertebral disc degeneration, lumbar region: Secondary | ICD-10-CM | POA: Diagnosis not present

## 2020-06-16 DIAGNOSIS — M5030 Other cervical disc degeneration, unspecified cervical region: Secondary | ICD-10-CM | POA: Diagnosis not present

## 2020-06-16 DIAGNOSIS — M5136 Other intervertebral disc degeneration, lumbar region: Secondary | ICD-10-CM | POA: Diagnosis not present

## 2020-06-16 DIAGNOSIS — M9903 Segmental and somatic dysfunction of lumbar region: Secondary | ICD-10-CM | POA: Diagnosis not present

## 2020-06-16 DIAGNOSIS — M542 Cervicalgia: Secondary | ICD-10-CM | POA: Diagnosis not present

## 2020-06-18 DIAGNOSIS — M5030 Other cervical disc degeneration, unspecified cervical region: Secondary | ICD-10-CM | POA: Diagnosis not present

## 2020-06-18 DIAGNOSIS — M542 Cervicalgia: Secondary | ICD-10-CM | POA: Diagnosis not present

## 2020-06-18 DIAGNOSIS — M5136 Other intervertebral disc degeneration, lumbar region: Secondary | ICD-10-CM | POA: Diagnosis not present

## 2020-06-18 DIAGNOSIS — M9903 Segmental and somatic dysfunction of lumbar region: Secondary | ICD-10-CM | POA: Diagnosis not present

## 2020-07-08 DIAGNOSIS — M19042 Primary osteoarthritis, left hand: Secondary | ICD-10-CM | POA: Diagnosis not present

## 2020-07-08 DIAGNOSIS — I10 Essential (primary) hypertension: Secondary | ICD-10-CM | POA: Diagnosis not present

## 2020-07-08 DIAGNOSIS — M19041 Primary osteoarthritis, right hand: Secondary | ICD-10-CM | POA: Diagnosis not present

## 2020-07-08 DIAGNOSIS — E785 Hyperlipidemia, unspecified: Secondary | ICD-10-CM | POA: Diagnosis not present

## 2020-07-09 DIAGNOSIS — M5136 Other intervertebral disc degeneration, lumbar region: Secondary | ICD-10-CM | POA: Diagnosis not present

## 2020-07-09 DIAGNOSIS — M5030 Other cervical disc degeneration, unspecified cervical region: Secondary | ICD-10-CM | POA: Diagnosis not present

## 2020-07-09 DIAGNOSIS — M542 Cervicalgia: Secondary | ICD-10-CM | POA: Diagnosis not present

## 2020-07-09 DIAGNOSIS — M9903 Segmental and somatic dysfunction of lumbar region: Secondary | ICD-10-CM | POA: Diagnosis not present

## 2020-07-12 DIAGNOSIS — M542 Cervicalgia: Secondary | ICD-10-CM | POA: Diagnosis not present

## 2020-07-12 DIAGNOSIS — M5136 Other intervertebral disc degeneration, lumbar region: Secondary | ICD-10-CM | POA: Diagnosis not present

## 2020-07-12 DIAGNOSIS — M9903 Segmental and somatic dysfunction of lumbar region: Secondary | ICD-10-CM | POA: Diagnosis not present

## 2020-07-12 DIAGNOSIS — M5030 Other cervical disc degeneration, unspecified cervical region: Secondary | ICD-10-CM | POA: Diagnosis not present

## 2020-09-01 DIAGNOSIS — M5441 Lumbago with sciatica, right side: Secondary | ICD-10-CM | POA: Diagnosis not present

## 2020-09-01 DIAGNOSIS — G8929 Other chronic pain: Secondary | ICD-10-CM | POA: Diagnosis not present

## 2020-09-01 DIAGNOSIS — M5416 Radiculopathy, lumbar region: Secondary | ICD-10-CM | POA: Diagnosis not present

## 2020-09-01 DIAGNOSIS — I10 Essential (primary) hypertension: Secondary | ICD-10-CM | POA: Diagnosis not present

## 2020-09-01 DIAGNOSIS — M25551 Pain in right hip: Secondary | ICD-10-CM | POA: Diagnosis not present

## 2020-09-01 DIAGNOSIS — M461 Sacroiliitis, not elsewhere classified: Secondary | ICD-10-CM | POA: Diagnosis not present

## 2020-09-08 DIAGNOSIS — M19042 Primary osteoarthritis, left hand: Secondary | ICD-10-CM | POA: Diagnosis not present

## 2020-09-08 DIAGNOSIS — I10 Essential (primary) hypertension: Secondary | ICD-10-CM | POA: Diagnosis not present

## 2020-09-08 DIAGNOSIS — M19041 Primary osteoarthritis, right hand: Secondary | ICD-10-CM | POA: Diagnosis not present

## 2020-09-08 DIAGNOSIS — E785 Hyperlipidemia, unspecified: Secondary | ICD-10-CM | POA: Diagnosis not present

## 2020-09-13 DIAGNOSIS — M9905 Segmental and somatic dysfunction of pelvic region: Secondary | ICD-10-CM | POA: Diagnosis not present

## 2020-09-13 DIAGNOSIS — M9904 Segmental and somatic dysfunction of sacral region: Secondary | ICD-10-CM | POA: Diagnosis not present

## 2020-09-13 DIAGNOSIS — M5136 Other intervertebral disc degeneration, lumbar region: Secondary | ICD-10-CM | POA: Diagnosis not present

## 2020-09-13 DIAGNOSIS — M9903 Segmental and somatic dysfunction of lumbar region: Secondary | ICD-10-CM | POA: Diagnosis not present

## 2020-09-14 DIAGNOSIS — M461 Sacroiliitis, not elsewhere classified: Secondary | ICD-10-CM | POA: Diagnosis not present

## 2020-09-23 ENCOUNTER — Other Ambulatory Visit: Payer: Self-pay

## 2020-09-23 ENCOUNTER — Ambulatory Visit
Admission: RE | Admit: 2020-09-23 | Discharge: 2020-09-23 | Disposition: A | Discharge status: Home / Self Care | Payer: Medicare Other | Source: Ambulatory Visit | Attending: Internal Medicine | Admitting: Internal Medicine

## 2020-09-23 DIAGNOSIS — Z1231 Encounter for screening mammogram for malignant neoplasm of breast: Secondary | ICD-10-CM | POA: Diagnosis not present

## 2020-10-07 DIAGNOSIS — M48061 Spinal stenosis, lumbar region without neurogenic claudication: Secondary | ICD-10-CM | POA: Diagnosis not present

## 2020-10-07 DIAGNOSIS — M5416 Radiculopathy, lumbar region: Secondary | ICD-10-CM | POA: Diagnosis not present

## 2020-10-07 DIAGNOSIS — M461 Sacroiliitis, not elsewhere classified: Secondary | ICD-10-CM | POA: Diagnosis not present

## 2020-10-11 ENCOUNTER — Other Ambulatory Visit: Payer: Self-pay | Admitting: Podiatry

## 2020-10-25 DIAGNOSIS — H6981 Other specified disorders of Eustachian tube, right ear: Secondary | ICD-10-CM | POA: Diagnosis not present

## 2020-10-25 DIAGNOSIS — H60551 Acute reactive otitis externa, right ear: Secondary | ICD-10-CM | POA: Diagnosis not present

## 2020-10-26 ENCOUNTER — Encounter: Payer: Self-pay | Admitting: Podiatry

## 2020-10-26 ENCOUNTER — Other Ambulatory Visit: Payer: Self-pay

## 2020-10-26 ENCOUNTER — Ambulatory Visit: Payer: Medicare Other | Admitting: Podiatry

## 2020-10-26 DIAGNOSIS — M5416 Radiculopathy, lumbar region: Secondary | ICD-10-CM | POA: Diagnosis not present

## 2020-10-26 DIAGNOSIS — Z09 Encounter for follow-up examination after completed treatment for conditions other than malignant neoplasm: Secondary | ICD-10-CM | POA: Diagnosis not present

## 2020-10-26 DIAGNOSIS — G5762 Lesion of plantar nerve, left lower limb: Secondary | ICD-10-CM | POA: Diagnosis not present

## 2020-10-26 DIAGNOSIS — G5782 Other specified mononeuropathies of left lower limb: Secondary | ICD-10-CM

## 2020-10-27 NOTE — Progress Notes (Signed)
She presents today for follow-up of her neuroma and neurectomy March 26, 2020 third interspace left foot states that she still gets some swelling in the area and particularly underneath.  Feels better than it did though.  Objective: Vital signs are stable is alert and oriented x3 on palpation third interspace still has some scar tissue within the interspace plantarly and dorsally.  Assessment: Slowly resolving neurectomy.  Plan: Follow-up with [place] as needed.

## 2020-11-12 DIAGNOSIS — H66001 Acute suppurative otitis media without spontaneous rupture of ear drum, right ear: Secondary | ICD-10-CM | POA: Diagnosis not present

## 2020-11-16 DIAGNOSIS — M5416 Radiculopathy, lumbar region: Secondary | ICD-10-CM | POA: Diagnosis not present

## 2020-12-03 DIAGNOSIS — M19042 Primary osteoarthritis, left hand: Secondary | ICD-10-CM | POA: Diagnosis not present

## 2020-12-03 DIAGNOSIS — M19041 Primary osteoarthritis, right hand: Secondary | ICD-10-CM | POA: Diagnosis not present

## 2020-12-03 DIAGNOSIS — I10 Essential (primary) hypertension: Secondary | ICD-10-CM | POA: Diagnosis not present

## 2020-12-03 DIAGNOSIS — E785 Hyperlipidemia, unspecified: Secondary | ICD-10-CM | POA: Diagnosis not present

## 2020-12-07 DIAGNOSIS — M25551 Pain in right hip: Secondary | ICD-10-CM | POA: Diagnosis not present

## 2020-12-07 DIAGNOSIS — M5416 Radiculopathy, lumbar region: Secondary | ICD-10-CM | POA: Diagnosis not present

## 2020-12-07 DIAGNOSIS — I10 Essential (primary) hypertension: Secondary | ICD-10-CM | POA: Diagnosis not present

## 2020-12-07 DIAGNOSIS — M5126 Other intervertebral disc displacement, lumbar region: Secondary | ICD-10-CM | POA: Diagnosis not present

## 2020-12-13 DIAGNOSIS — K580 Irritable bowel syndrome with diarrhea: Secondary | ICD-10-CM | POA: Diagnosis not present

## 2020-12-13 DIAGNOSIS — M19042 Primary osteoarthritis, left hand: Secondary | ICD-10-CM | POA: Diagnosis not present

## 2020-12-13 DIAGNOSIS — Z1211 Encounter for screening for malignant neoplasm of colon: Secondary | ICD-10-CM | POA: Diagnosis not present

## 2020-12-13 DIAGNOSIS — Z23 Encounter for immunization: Secondary | ICD-10-CM | POA: Diagnosis not present

## 2020-12-13 DIAGNOSIS — E785 Hyperlipidemia, unspecified: Secondary | ICD-10-CM | POA: Diagnosis not present

## 2020-12-13 DIAGNOSIS — Z0000 Encounter for general adult medical examination without abnormal findings: Secondary | ICD-10-CM | POA: Diagnosis not present

## 2020-12-13 DIAGNOSIS — Z1231 Encounter for screening mammogram for malignant neoplasm of breast: Secondary | ICD-10-CM | POA: Diagnosis not present

## 2020-12-13 DIAGNOSIS — I10 Essential (primary) hypertension: Secondary | ICD-10-CM | POA: Diagnosis not present

## 2020-12-13 DIAGNOSIS — I700 Atherosclerosis of aorta: Secondary | ICD-10-CM | POA: Diagnosis not present

## 2020-12-13 DIAGNOSIS — Z1389 Encounter for screening for other disorder: Secondary | ICD-10-CM | POA: Diagnosis not present

## 2020-12-13 DIAGNOSIS — M19041 Primary osteoarthritis, right hand: Secondary | ICD-10-CM | POA: Diagnosis not present

## 2020-12-14 DIAGNOSIS — M9903 Segmental and somatic dysfunction of lumbar region: Secondary | ICD-10-CM | POA: Diagnosis not present

## 2020-12-14 DIAGNOSIS — M5136 Other intervertebral disc degeneration, lumbar region: Secondary | ICD-10-CM | POA: Diagnosis not present

## 2020-12-14 DIAGNOSIS — M9904 Segmental and somatic dysfunction of sacral region: Secondary | ICD-10-CM | POA: Diagnosis not present

## 2020-12-14 DIAGNOSIS — M9905 Segmental and somatic dysfunction of pelvic region: Secondary | ICD-10-CM | POA: Diagnosis not present

## 2020-12-21 DIAGNOSIS — G5701 Lesion of sciatic nerve, right lower limb: Secondary | ICD-10-CM | POA: Diagnosis not present

## 2021-01-06 DIAGNOSIS — M180 Bilateral primary osteoarthritis of first carpometacarpal joints: Secondary | ICD-10-CM | POA: Diagnosis not present

## 2021-02-02 DIAGNOSIS — G5701 Lesion of sciatic nerve, right lower limb: Secondary | ICD-10-CM | POA: Diagnosis not present

## 2021-02-02 DIAGNOSIS — M48061 Spinal stenosis, lumbar region without neurogenic claudication: Secondary | ICD-10-CM | POA: Diagnosis not present

## 2021-02-02 DIAGNOSIS — M5416 Radiculopathy, lumbar region: Secondary | ICD-10-CM | POA: Diagnosis not present

## 2021-02-04 ENCOUNTER — Encounter: Payer: Self-pay | Admitting: Gastroenterology

## 2021-02-04 ENCOUNTER — Ambulatory Visit (INDEPENDENT_AMBULATORY_CARE_PROVIDER_SITE_OTHER): Payer: Medicare Other | Admitting: Gastroenterology

## 2021-02-04 ENCOUNTER — Other Ambulatory Visit: Payer: Self-pay

## 2021-02-04 VITALS — BP 130/82 | HR 65 | Ht 62.0 in | Wt 145.2 lb

## 2021-02-04 DIAGNOSIS — K449 Diaphragmatic hernia without obstruction or gangrene: Secondary | ICD-10-CM | POA: Diagnosis not present

## 2021-02-04 DIAGNOSIS — K580 Irritable bowel syndrome with diarrhea: Secondary | ICD-10-CM

## 2021-02-04 DIAGNOSIS — K648 Other hemorrhoids: Secondary | ICD-10-CM

## 2021-02-04 DIAGNOSIS — K219 Gastro-esophageal reflux disease without esophagitis: Secondary | ICD-10-CM

## 2021-02-04 MED ORDER — HYDROCORTISONE ACETATE 25 MG RE SUPP: 25.0000 mg | Freq: Two times a day (BID) | RECTAL | 6 refills | Status: DC

## 2021-02-04 MED ORDER — DICYCLOMINE HCL 10 MG PO CAPS: 10.0000 mg | ORAL_CAPSULE | Freq: Four times a day (QID) | ORAL | 3 refills | Status: DC

## 2021-02-04 NOTE — Patient Instructions (Addendum)
If you are age 75 or older, your body mass index should be between 23-30. Your Body mass index is 26.57 kg/m. If this is out of the aforementioned range listed, please consider follow up with your Primary Care Provider.  If you are age 54 or younger, your body mass index should be between 19-25. Your Body mass index is 26.57 kg/m. If this is out of the aformentioned range listed, please consider follow up with your Primary Care Provider.   ________________________________________________________  The [facility] providers would like to encourage you to use [facility] to communicate with providers for non-urgent requests or questions.  Due to long hold times on the telephone, sending your provider a message by [facility] may be a faster and more efficient way to get a response.  Please allow 48 business hours for a response.  Please remember that this is for non-urgent requests.  _______________________________________________________  Continue Bentyl and Anusol supp.  Follow up as needed.  Thank you,  Dr. Jackquline Denmark

## 2021-02-04 NOTE — Progress Notes (Signed)
Chief Complaint: FU  Referring Provider:  Leeroy Cha,*      ASSESSMENT AND PLAN;   #1. IBS-D  with assoc lymphocytic colitis. Neg colon 10/2016 except for mild left colonic div and int hoids. Random colon Bx- lymphocytic colitis.  Responded well to Bentyl and taking her off NSAIDs. Neg CT abdo/pel 12/01/2016  #2. Small int hoids  #3. GERD with small HH on CT 11/2016.  Plan: - Continue Bentyl 10mg  po qid prn #120, 11 refills - Continue Anusol HC supp PR 1 bid x 10 days PRN, 6 refills. - FU as needed.  HPI:    Ashley Brennan is a 75 y.o. female   For FU  Doing great.  Here for prescription refill for Bentyl and anusol   No nausea, vomiting, regurgitation, odynophagia or dysphagia.  No significant diarrhea or constipation.  No melena or hematochezia. No unintentional weight loss. No abdominal pain.  Occ hearburn - better with as needed Tagamet.   No nonsteroidals  Still takes magnesium on as needed basis.  Past GI procedures: Colonoscopy 11/03/2016 ([facility]): Mild left colonic diverticulosis, internal hemorrhoids, otherwise normal to TI. Bx-random colon biopsies positive for lymphocytic colitis. Remote history of EGD per patient.  [name]. Daughter in [place]. Mom passed away 06-07-2019 (dementia [place])  Past Medical History:  Diagnosis Date   Arthritis    Stated she get shots in her hands and feet   Asthma    Back pain    Diverticulosis    Fecal incontinence    Flatulence    GERD (gastroesophageal reflux disease)    History of colon polyps    HTN (hypertension)    Hyperlipidemia    IBS (irritable bowel syndrome)    SCC (squamous cell carcinoma) 10/24/2016   RIGHT WRIST TX WITH BX   Seasonal allergies    Skin cancer    Squamous cell   Squamous cell carcinoma of skin 05/10/2008   LEFT FORNT SCALP CX3 5FU   Squamous cell carcinoma of skin 12/09/2019   right lower leg-anterior (CX35FU)   Squamous cell carcinoma of skin 12/09/2019    [facility] (observe/no tmt per ST)    Past Surgical History:  Procedure Laterality Date   ABDOMINAL HYSTERECTOMY     APPENDECTOMY     BACK SURGERY     CATARACT EXTRACTION, BILATERAL     COLONOSCOPY  11/03/2016   Diverticulosis of colon. Internal hemorrhoids   FOOT SURGERY Right    said they put her joint in her foot. [facility]   KNEE SURGERY      Family History  Problem Relation Age of Onset   Prostate cancer Father    Lung cancer Father    Colon cancer Neg Hx    Esophageal cancer Neg Hx     Social History   Tobacco Use   Smoking status: Former   Smokeless tobacco: Never  Scientific laboratory technician Use: Never used  Substance Use Topics   Alcohol use: Yes    Comment: glass of wine with tonic   Drug use: Not Currently    Current Outpatient Medications  Medication Sig Dispense Refill   Acetaminophen (TYLENOL PO) Take 1 tablet by mouth as needed.     atorvastatin (LIPITOR) 20 MG tablet Take 20 mg by mouth daily.     Calcium Carbonate Antacid (TUMS PO) Take by mouth daily.     COLLAGEN PO Take by mouth daily.     dicyclomine (BENTYL) 10 MG capsule  Take 1 capsule (10 mg total) by mouth 4 (four) times daily -  before meals and at bedtime. Please call [phone] to schedule an office visit for more refills. 360 capsule 0   fluticasone (CUTIVATE) 0.05 % cream Apply topically 2 (two) times daily. X 10 days 30 g 2   hydrocortisone (ANUSOL-HC) 25 MG suppository Place 1 suppository (25 mg total) rectally 2 (two) times daily. 20 suppository 6   losartan (COZAAR) 50 MG tablet Take 50 mg by mouth daily.     Polyethyl Glycol-Propyl Glycol (SYSTANE) 0.4-0.3 % GEL ophthalmic gel Apply 1 application to eye as needed.      verapamil (CALAN-SR) 120 MG CR tablet Take 120 mg by mouth daily.      No current facility-administered medications for this visit.    Allergies  Allergen Reactions   Omeprazole Other (See Comments)    Review of Systems:  neg     Physical Exam:     BP 130/82   Pulse 65   Ht 5\' 2"  (1.575 m)   Wt 145 lb 4 oz (65.9 kg)   SpO2 97%   BMI 26.57 kg/m  Filed Weights   02/04/21 0928  Weight: 145 lb 4 oz (65.9 kg)   Constitutional:  Well-developed, in no acute distress. Psychiatric: Normal mood and affect. Behavior is normal. HEENT: No scleral icterus. Cardiovascular: Normal rate, regular rhythm. No edema Pulmonary/chest: Effort normal and breath sounds normal. No wheezing, rales or rhonchi. Abdominal: Soft, nondistended. Nontender. Bowel sounds active throughout. There are no masses palpable. No hepatomegaly. Neurological: Alert and oriented to person place and time. Skin: Skin is warm and dry. No rashes noted.  Data Reviewed: I have personally reviewed following labs and imaging studies  CBC: CBC Latest Ref Rng & Units 08/19/2009  WBC 4.0 - 10.5 K/uL 6.1  Hemoglobin 12.0 - 15.0 g/dL 14.8  Hematocrit 36.0 - 46.0 % 43.2  Platelets 150 - 400 K/uL 325    CMP: CMP Latest Ref Rng & Units 08/19/2009  Glucose 70 - 99 mg/dL 99  BUN 6 - 23 mg/dL 11  Creatinine 0.4 - 1.2 mg/dL 1.10  Sodium 135 - 145 mEq/L 140  Potassium 3.5 - 5.1 mEq/L 3.8  Chloride 96 - 112 mEq/L 100  CO2 19 - 32 mEq/L 33(H)  Calcium 8.4 - 10.5 mg/dL 9.5  I spent 25 minutes of face-to-face time with the patient. Greater than 50% of the time was spent counseling and coordinating care.     Carmell Austria, MD 02/04/2021, 9:36 AM  Cc: Leeroy Cha,*

## 2021-03-16 DIAGNOSIS — R0981 Nasal congestion: Secondary | ICD-10-CM | POA: Diagnosis not present

## 2021-03-16 DIAGNOSIS — J0100 Acute maxillary sinusitis, unspecified: Secondary | ICD-10-CM | POA: Diagnosis not present

## 2021-03-29 DIAGNOSIS — I10 Essential (primary) hypertension: Secondary | ICD-10-CM | POA: Diagnosis not present

## 2021-03-29 DIAGNOSIS — E785 Hyperlipidemia, unspecified: Secondary | ICD-10-CM | POA: Diagnosis not present

## 2021-03-31 DIAGNOSIS — L570 Actinic keratosis: Secondary | ICD-10-CM | POA: Diagnosis not present

## 2021-04-05 ENCOUNTER — Other Ambulatory Visit: Payer: Self-pay | Admitting: Gastroenterology

## 2021-04-20 ENCOUNTER — Other Ambulatory Visit: Payer: Self-pay | Admitting: Gastroenterology

## 2021-04-21 ENCOUNTER — Encounter: Payer: Self-pay | Admitting: Gastroenterology

## 2021-04-21 ENCOUNTER — Other Ambulatory Visit: Payer: Self-pay

## 2021-04-21 MED ORDER — DICYCLOMINE HCL 10 MG PO CAPS: 10.0000 mg | ORAL_CAPSULE | Freq: Four times a day (QID) | ORAL | 3 refills | Status: DC

## 2021-04-26 DIAGNOSIS — M9901 Segmental and somatic dysfunction of cervical region: Secondary | ICD-10-CM | POA: Diagnosis not present

## 2021-04-26 DIAGNOSIS — M9902 Segmental and somatic dysfunction of thoracic region: Secondary | ICD-10-CM | POA: Diagnosis not present

## 2021-04-26 DIAGNOSIS — M5134 Other intervertebral disc degeneration, thoracic region: Secondary | ICD-10-CM | POA: Diagnosis not present

## 2021-04-26 DIAGNOSIS — M50322 Other cervical disc degeneration at C5-C6 level: Secondary | ICD-10-CM | POA: Diagnosis not present

## 2021-04-27 DIAGNOSIS — M50322 Other cervical disc degeneration at C5-C6 level: Secondary | ICD-10-CM | POA: Diagnosis not present

## 2021-04-27 DIAGNOSIS — M5134 Other intervertebral disc degeneration, thoracic region: Secondary | ICD-10-CM | POA: Diagnosis not present

## 2021-04-27 DIAGNOSIS — M9902 Segmental and somatic dysfunction of thoracic region: Secondary | ICD-10-CM | POA: Diagnosis not present

## 2021-04-27 DIAGNOSIS — M9901 Segmental and somatic dysfunction of cervical region: Secondary | ICD-10-CM | POA: Diagnosis not present

## 2021-04-28 DIAGNOSIS — G5701 Lesion of sciatic nerve, right lower limb: Secondary | ICD-10-CM | POA: Diagnosis not present

## 2021-05-10 ENCOUNTER — Encounter: Payer: Self-pay | Admitting: Podiatry

## 2021-05-10 ENCOUNTER — Ambulatory Visit (INDEPENDENT_AMBULATORY_CARE_PROVIDER_SITE_OTHER): Payer: Medicare Other

## 2021-05-10 ENCOUNTER — Ambulatory Visit: Payer: Medicare Other | Admitting: Podiatry

## 2021-05-10 ENCOUNTER — Other Ambulatory Visit: Payer: Self-pay

## 2021-05-10 DIAGNOSIS — M778 Other enthesopathies, not elsewhere classified: Secondary | ICD-10-CM | POA: Diagnosis not present

## 2021-05-10 NOTE — Progress Notes (Signed)
She presents today had been complaining about pain to the dorsal aspect of her forefoot left.  A neurectomy had been performed here at 1 time but she states that is just beside of that.  She states that [place] and felt a [place].  She said it was bruised and it was swollen.  She states that of course today is doing much better.  Objective: Vital signs are stable she is alert and oriented x3.  Pulses are palpable.  There is no erythema edema cellulitis drainage or odor there is some residual ecchymosis overlying the third metatarsal area.  This appears to be more of hyperpigmentation possibly.  No pain on palpation to the area in question today.  Radiographs taken today do not demonstrate any type of osseous abnormalities of this left foot.  Assessment: Probable contusion foot.  Plan: Follow-up with me as needed.

## 2021-06-01 DIAGNOSIS — D225 Melanocytic nevi of trunk: Secondary | ICD-10-CM | POA: Diagnosis not present

## 2021-06-01 DIAGNOSIS — D1801 Hemangioma of skin and subcutaneous tissue: Secondary | ICD-10-CM | POA: Diagnosis not present

## 2021-06-01 DIAGNOSIS — L821 Other seborrheic keratosis: Secondary | ICD-10-CM | POA: Diagnosis not present

## 2021-06-01 DIAGNOSIS — L720 Epidermal cyst: Secondary | ICD-10-CM | POA: Diagnosis not present

## 2021-06-01 DIAGNOSIS — L814 Other melanin hyperpigmentation: Secondary | ICD-10-CM | POA: Diagnosis not present

## 2021-06-01 DIAGNOSIS — Z85828 Personal history of other malignant neoplasm of skin: Secondary | ICD-10-CM | POA: Diagnosis not present

## 2021-06-01 DIAGNOSIS — D2262 Melanocytic nevi of left upper limb, including shoulder: Secondary | ICD-10-CM | POA: Diagnosis not present

## 2021-06-01 DIAGNOSIS — D692 Other nonthrombocytopenic purpura: Secondary | ICD-10-CM | POA: Diagnosis not present

## 2021-06-01 DIAGNOSIS — D2371 Other benign neoplasm of skin of right lower limb, including hip: Secondary | ICD-10-CM | POA: Diagnosis not present

## 2021-08-29 DIAGNOSIS — G5701 Lesion of sciatic nerve, right lower limb: Secondary | ICD-10-CM | POA: Diagnosis not present

## 2021-09-01 ENCOUNTER — Other Ambulatory Visit: Payer: Self-pay | Admitting: Internal Medicine

## 2021-09-01 DIAGNOSIS — Z1231 Encounter for screening mammogram for malignant neoplasm of breast: Secondary | ICD-10-CM

## 2021-09-08 DIAGNOSIS — M180 Bilateral primary osteoarthritis of first carpometacarpal joints: Secondary | ICD-10-CM | POA: Diagnosis not present

## 2021-09-26 DIAGNOSIS — H35371 Puckering of macula, right eye: Secondary | ICD-10-CM | POA: Diagnosis not present

## 2021-09-26 DIAGNOSIS — H52203 Unspecified astigmatism, bilateral: Secondary | ICD-10-CM | POA: Diagnosis not present

## 2021-09-26 DIAGNOSIS — Z961 Presence of intraocular lens: Secondary | ICD-10-CM | POA: Diagnosis not present

## 2021-09-27 ENCOUNTER — Ambulatory Visit
Admission: RE | Admit: 2021-09-27 | Discharge: 2021-09-27 | Disposition: A | Discharge status: Home / Self Care | Payer: Medicare Other | Source: Ambulatory Visit | Attending: Internal Medicine | Admitting: Internal Medicine

## 2021-09-27 DIAGNOSIS — Z1231 Encounter for screening mammogram for malignant neoplasm of breast: Secondary | ICD-10-CM | POA: Diagnosis not present

## 2021-09-29 ENCOUNTER — Other Ambulatory Visit: Payer: Self-pay | Admitting: Internal Medicine

## 2021-09-29 DIAGNOSIS — R928 Other abnormal and inconclusive findings on diagnostic imaging of breast: Secondary | ICD-10-CM

## 2021-10-07 ENCOUNTER — Ambulatory Visit
Admission: RE | Admit: 2021-10-07 | Discharge: 2021-10-07 | Disposition: A | Discharge status: Home / Self Care | Payer: Medicare Other | Source: Ambulatory Visit | Attending: Internal Medicine | Admitting: Internal Medicine

## 2021-10-07 ENCOUNTER — Ambulatory Visit: Admission: RE | Admit: 2021-10-07 | Payer: Medicare Other | Source: Ambulatory Visit

## 2021-10-07 ENCOUNTER — Other Ambulatory Visit: Payer: Self-pay | Admitting: Internal Medicine

## 2021-10-07 DIAGNOSIS — N6489 Other specified disorders of breast: Secondary | ICD-10-CM

## 2021-10-07 DIAGNOSIS — R928 Other abnormal and inconclusive findings on diagnostic imaging of breast: Secondary | ICD-10-CM

## 2021-10-17 ENCOUNTER — Ambulatory Visit
Admission: RE | Admit: 2021-10-17 | Discharge: 2021-10-17 | Disposition: A | Discharge status: Home / Self Care | Payer: Medicare Other | Source: Ambulatory Visit | Attending: Internal Medicine | Admitting: Internal Medicine

## 2021-10-17 DIAGNOSIS — N6012 Diffuse cystic mastopathy of left breast: Secondary | ICD-10-CM | POA: Diagnosis not present

## 2021-10-17 DIAGNOSIS — R928 Other abnormal and inconclusive findings on diagnostic imaging of breast: Secondary | ICD-10-CM | POA: Diagnosis not present

## 2021-10-17 DIAGNOSIS — N6489 Other specified disorders of breast: Secondary | ICD-10-CM

## 2021-11-25 DIAGNOSIS — R059 Cough, unspecified: Secondary | ICD-10-CM | POA: Diagnosis not present

## 2021-11-25 DIAGNOSIS — I10 Essential (primary) hypertension: Secondary | ICD-10-CM | POA: Diagnosis not present

## 2021-12-01 DIAGNOSIS — G5701 Lesion of sciatic nerve, right lower limb: Secondary | ICD-10-CM | POA: Diagnosis not present

## 2021-12-08 DIAGNOSIS — M180 Bilateral primary osteoarthritis of first carpometacarpal joints: Secondary | ICD-10-CM | POA: Diagnosis not present

## 2021-12-08 DIAGNOSIS — M25512 Pain in left shoulder: Secondary | ICD-10-CM | POA: Diagnosis not present

## 2021-12-08 DIAGNOSIS — G8929 Other chronic pain: Secondary | ICD-10-CM | POA: Diagnosis not present

## 2021-12-19 DIAGNOSIS — M47816 Spondylosis without myelopathy or radiculopathy, lumbar region: Secondary | ICD-10-CM | POA: Diagnosis not present

## 2021-12-19 DIAGNOSIS — E785 Hyperlipidemia, unspecified: Secondary | ICD-10-CM | POA: Diagnosis not present

## 2021-12-19 DIAGNOSIS — I10 Essential (primary) hypertension: Secondary | ICD-10-CM | POA: Diagnosis not present

## 2021-12-19 DIAGNOSIS — I8393 Asymptomatic varicose veins of bilateral lower extremities: Secondary | ICD-10-CM | POA: Diagnosis not present

## 2021-12-19 DIAGNOSIS — M19041 Primary osteoarthritis, right hand: Secondary | ICD-10-CM | POA: Diagnosis not present

## 2021-12-19 DIAGNOSIS — Z0000 Encounter for general adult medical examination without abnormal findings: Secondary | ICD-10-CM | POA: Diagnosis not present

## 2021-12-19 DIAGNOSIS — I700 Atherosclerosis of aorta: Secondary | ICD-10-CM | POA: Diagnosis not present

## 2021-12-28 ENCOUNTER — Other Ambulatory Visit: Payer: Self-pay | Admitting: Internal Medicine

## 2021-12-28 DIAGNOSIS — E2839 Other primary ovarian failure: Secondary | ICD-10-CM

## 2022-01-04 DIAGNOSIS — M9901 Segmental and somatic dysfunction of cervical region: Secondary | ICD-10-CM | POA: Diagnosis not present

## 2022-01-04 DIAGNOSIS — M5033 Other cervical disc degeneration, cervicothoracic region: Secondary | ICD-10-CM | POA: Diagnosis not present

## 2022-01-12 ENCOUNTER — Other Ambulatory Visit: Payer: Self-pay

## 2022-01-12 ENCOUNTER — Encounter (HOSPITAL_BASED_OUTPATIENT_CLINIC_OR_DEPARTMENT_OTHER): Payer: Self-pay

## 2022-01-12 ENCOUNTER — Emergency Department (HOSPITAL_BASED_OUTPATIENT_CLINIC_OR_DEPARTMENT_OTHER)
Admission: EM | Admit: 2022-01-12 | Discharge: 2022-01-13 | Disposition: A | Discharge status: Home / Self Care | Payer: Medicare Other | Attending: Emergency Medicine | Admitting: Emergency Medicine

## 2022-01-12 ENCOUNTER — Emergency Department (HOSPITAL_BASED_OUTPATIENT_CLINIC_OR_DEPARTMENT_OTHER): Payer: Medicare Other | Admitting: Radiology

## 2022-01-12 DIAGNOSIS — R079 Chest pain, unspecified: Secondary | ICD-10-CM

## 2022-01-12 DIAGNOSIS — Z87891 Personal history of nicotine dependence: Secondary | ICD-10-CM | POA: Insufficient documentation

## 2022-01-12 DIAGNOSIS — R0789 Other chest pain: Secondary | ICD-10-CM | POA: Diagnosis not present

## 2022-01-12 DIAGNOSIS — I10 Essential (primary) hypertension: Secondary | ICD-10-CM | POA: Diagnosis not present

## 2022-01-12 DIAGNOSIS — J45909 Unspecified asthma, uncomplicated: Secondary | ICD-10-CM | POA: Diagnosis not present

## 2022-01-12 DIAGNOSIS — Z85828 Personal history of other malignant neoplasm of skin: Secondary | ICD-10-CM | POA: Insufficient documentation

## 2022-01-12 DIAGNOSIS — K2900 Acute gastritis without bleeding: Secondary | ICD-10-CM | POA: Insufficient documentation

## 2022-01-12 LAB — BASIC METABOLIC PANEL
Anion gap: 12 (ref 5–15)
BUN: 17 mg/dL (ref 8–23)
CO2: 28 mmol/L (ref 22–32)
Calcium: 10 mg/dL (ref 8.9–10.3)
Chloride: 102 mmol/L (ref 98–111)
Creatinine, Ser: 0.87 mg/dL (ref 0.44–1.00)
GFR, Estimated: >60 mL/min (ref >60)
Glucose, Bld: 105 mg/dL — ABNORMAL HIGH (ref 70–99)
Potassium: 3.7 mmol/L (ref 3.5–5.1)
Sodium: 142 mmol/L (ref 135–145)

## 2022-01-12 LAB — CBC
HCT: 46.8 % — ABNORMAL HIGH (ref 36.0–46.0)
Hemoglobin: 15.3 g/dL — ABNORMAL HIGH (ref 12.0–15.0)
MCH: 29.4 pg (ref 26.0–34.0)
MCHC: 32.7 g/dL (ref 30.0–36.0)
MCV: 89.8 fL (ref 80.0–100.0)
Platelets: 266 10*3/uL (ref 150–400)
RBC: 5.21 MIL/uL — ABNORMAL HIGH (ref 3.87–5.11)
RDW: 12.5 % (ref 11.5–15.5)
WBC: 6.7 10*3/uL (ref 4.0–10.5)
nRBC: 0 % (ref 0.0–0.2)

## 2022-01-12 LAB — TROPONIN I (HIGH SENSITIVITY): Troponin I (High Sensitivity): 3 ng/L (ref <18)

## 2022-01-12 NOTE — ED Triage Notes (Signed)
Patient here POV from Home.  Endorses Intermittent CP for approximately 1 Week. Lower Chest and radiates to Upper Left Back.   No SOB. No Fevers. No N/V.   NAD Noted during Triage. A&Ox4. GCS 15. Ambulatory.

## 2022-01-12 NOTE — ED Provider Notes (Signed)
[facility] Emergency Department Provider Note MRN:  [id number]  Arrival date & time: 01/13/22     Chief Complaint   Chest Pain   History of Present Illness   Ashley Brennan is a 76 y.o. year-old female with a history of IBS presenting to the ED with chief complaint of chest pain.  Epigastric pain for about a week, now having really bad chest pain for the past few days.  Took some antacid medicines and now feels better.  Pain was radiating to the left shoulder blade.  Had some nausea with the pain, no vomiting, normal bowel movements, no shortness of breath.  Review of Systems  A thorough review of systems was obtained and all systems are negative except as noted in the HPI and PMH.   Patient's Health History    Past Medical History:  Diagnosis Date   Arthritis    Stated she get shots in her hands and feet   Asthma    Back pain    Diverticulosis    Fecal incontinence    Flatulence    GERD (gastroesophageal reflux disease)    History of colon polyps    HTN (hypertension)    Hyperlipidemia    IBS (irritable bowel syndrome)    SCC (squamous cell carcinoma) 10/24/2016   RIGHT WRIST TX WITH BX   Seasonal allergies    Skin cancer    Squamous cell   Squamous cell carcinoma of skin 05/10/2008   LEFT FORNT SCALP CX3 5FU   Squamous cell carcinoma of skin 12/09/2019   right lower leg-anterior (CX35FU)   Squamous cell carcinoma of skin 12/09/2019   [facility]-medial center (observe/no tmt per ST)    Past Surgical History:  Procedure Laterality Date   ABDOMINAL HYSTERECTOMY     APPENDECTOMY     BACK SURGERY     CATARACT EXTRACTION, BILATERAL     COLONOSCOPY  11/03/2016   Diverticulosis of colon. Internal hemorrhoids   FOOT SURGERY Right    said they put her joint in her foot. [facility]   KNEE SURGERY      Family History  Problem Relation Age of Onset   Prostate cancer Father    Lung cancer Father    Colon cancer Neg Hx    Esophageal cancer Neg Hx      Social History   Socioeconomic History   Marital status: Married    Spouse name: Not on file   Number of children: 2   Years of education: Not on file   Highest education level: Not on file  Occupational History   Not on file  Tobacco Use   Smoking status: Former   Smokeless tobacco: Never  Vaping Use   Vaping Use: Never used  Substance and Sexual Activity   Alcohol use: Yes    Comment: glass of wine with tonic   Drug use: Not Currently   Sexual activity: Not on file  Other Topics Concern   Not on file  Social History Narrative   Not on file   Social Determinants of Health   Financial Resource Strain: Not on file  Food Insecurity: Not on file  Transportation Needs: Not on file  Physical Activity: Not on file  Stress: Not on file  Social Connections: Not on file  Intimate Partner Violence: Not on file     Physical Exam   Vitals:   01/13/22 0000 01/13/22 0005  BP:  (!) 150/86  Pulse: 72 69  Resp: 16 15  Temp:    SpO2: 100% 100%    CONSTITUTIONAL: Well-appearing, NAD NEURO/PSYCH:  Alert and oriented x 3, no focal deficits EYES:  eyes equal and reactive ENT/NECK:  no LAD, no JVD CARDIO: Regular rate, well-perfused, normal S1 and S2 PULM:  CTAB no wheezing or rhonchi GI/GU:  non-distended, non-tender MSK/SPINE:  No gross deformities, no edema SKIN:  no rash, atraumatic   *Additional and/or pertinent findings included in MDM below  Diagnostic and Interventional Summary    EKG Interpretation  Date/Time:  Thursday January 12 2022 21:21:40 EDT Ventricular Rate:  77 PR Interval:  162 QRS Duration: 72 QT Interval:  378 QTC Calculation: 427 R Axis:   54 Text Interpretation: Normal sinus rhythm Right atrial enlargement Nonspecific ST abnormality Abnormal ECG When compared with ECG of 19-Aug-2009 13:23, No significant change was found Confirmed by Gerlene Fee [phone]) on 01/12/2022 11:05:24 PM       Labs Reviewed  BASIC METABOLIC PANEL - Abnormal;  Notable for the following components:      Result Value   Glucose, Bld 105 (*)    All other components within normal limits  CBC - Abnormal; Notable for the following components:   RBC 5.21 (*)    Hemoglobin 15.3 (*)    HCT 46.8 (*)    All other components within normal limits  HEPATIC FUNCTION PANEL - Abnormal; Notable for the following components:   AST 14 (*)    All other components within normal limits  LIPASE, BLOOD  TROPONIN I (HIGH SENSITIVITY)  TROPONIN I (HIGH SENSITIVITY)    CT Angio Chest/Abd/Pel for Dissection W and/or Wo Contrast  Final Result    DG Chest 2 View  Final Result      Medications  iohexol (OMNIPAQUE) 350 MG/ML injection 100 mL (100 mLs Intravenous Contrast Given 01/13/22 0011)     Procedures  /  Critical Care Procedures  ED Course and Medical Decision Making  Initial Impression and Ddx Differential diagnosis includes GERD, ACS, aortic dissection, splenic infarct, pancreatitis  Past medical/surgical history that increases complexity of ED encounter: None  Interpretation of Diagnostics I personally reviewed the EKG and my interpretation is as follows: Sinus rhythm, no concerning ischemic features  Labs are reassuring with no significant blood count or electrolyte disturbance, troponin negative x2.  CTA is without evidence of dissection or pulmonary embolism or splenic pathology.  There is evidence of gastritis.  Patient Reassessment and Ultimate Disposition/Management     Patient continues to feel well with normal vital signs, appropriate for discharge with GI follow-up.  Patient management required discussion with the following services or consulting groups:  None  Complexity of Problems Addressed Acute illness or injury that poses threat of life of bodily function  Additional Data Reviewed and Analyzed Further history obtained from: Further history from spouse/family member  Additional Factors Impacting ED Encounter Risk Prescriptions  and Consideration of hospitalization  Barth Kirks. [place] Small, [place] [email or web address]  Final Clinical Impressions(s) / ED Diagnoses     ICD-10-CM   1. Acute gastritis without hemorrhage, unspecified gastritis type  K29.00     2. Chest pain, unspecified type  R07.9       ED Discharge Orders          Ordered    pantoprazole (PROTONIX) 20 MG tablet  Daily        01/13/22 0111    sucralfate (CARAFATE) 1 g tablet  4 times daily PRN  01/13/22 0111             Discharge Instructions Discussed with and Provided to Patient:     Discharge Instructions      You were evaluated in the Emergency Department and after careful evaluation, we did not find any emergent condition requiring admission or further testing in the hospital.  Your exam/testing today is overall reassuring.  Symptoms seem to be due to inflammation of your stomach.  Recommend the Protonix medication daily to prevent pain.  If you experience return of leg cramping, recommend stopping this medicine and discussing other options with the gastroenterologist.  Use the Carafate as needed up to 4 times daily for immediate relief.  Please return to the Emergency Department if you experience any worsening of your condition.   Thank you for allowing [place] to be a part of your care.       Maudie Flakes, MD 01/13/22 [phone]

## 2022-01-13 ENCOUNTER — Emergency Department (HOSPITAL_BASED_OUTPATIENT_CLINIC_OR_DEPARTMENT_OTHER): Payer: Medicare Other

## 2022-01-13 DIAGNOSIS — I700 Atherosclerosis of aorta: Secondary | ICD-10-CM | POA: Diagnosis not present

## 2022-01-13 LAB — LIPASE, BLOOD: Lipase: 33 U/L (ref 11–51)

## 2022-01-13 LAB — HEPATIC FUNCTION PANEL
ALT: 18 U/L (ref 0–44)
AST: 14 U/L — ABNORMAL LOW (ref 15–41)
Albumin: 4.2 g/dL (ref 3.5–5.0)
Alkaline Phosphatase: 55 U/L (ref 38–126)
Bilirubin, Direct: 0.1 mg/dL (ref 0.0–0.2)
Indirect Bilirubin: 0.3 mg/dL (ref 0.3–0.9)
Total Bilirubin: 0.4 mg/dL (ref 0.3–1.2)
Total Protein: 6.6 g/dL (ref 6.5–8.1)

## 2022-01-13 LAB — TROPONIN I (HIGH SENSITIVITY): Troponin I (High Sensitivity): 3 ng/L (ref <18)

## 2022-01-13 MED ORDER — PANTOPRAZOLE SODIUM 20 MG PO TBEC: 20.0000 mg | DELAYED_RELEASE_TABLET | Freq: Every day | ORAL | 1 refills | Status: DC

## 2022-01-13 MED ORDER — IOHEXOL 350 MG/ML SOLN
100.0000 mL | Freq: Once | INTRAVENOUS | Status: AC | PRN
  Administered 2022-01-13: 100 mL via INTRAVENOUS

## 2022-01-13 MED ORDER — SUCRALFATE 1 G PO TABS: 1.0000 g | ORAL_TABLET | Freq: Four times a day (QID) | ORAL | 0 refills | Status: DC | PRN

## 2022-01-13 NOTE — Discharge Instructions (Signed)
You were evaluated in the Emergency Department and after careful evaluation, we did not find any emergent condition requiring admission or further testing in the hospital.  Your exam/testing today is overall reassuring.  Symptoms seem to be due to inflammation of your stomach.  Recommend the Protonix medication daily to prevent pain.  If you experience return of leg cramping, recommend stopping this medicine and discussing other options with the gastroenterologist.  Use the Carafate as needed up to 4 times daily for immediate relief.  Please return to the Emergency Department if you experience any worsening of your condition.   Thank you for allowing [place] to be a part of your care.

## 2022-01-26 DIAGNOSIS — M9901 Segmental and somatic dysfunction of cervical region: Secondary | ICD-10-CM | POA: Diagnosis not present

## 2022-01-26 DIAGNOSIS — M5033 Other cervical disc degeneration, cervicothoracic region: Secondary | ICD-10-CM | POA: Diagnosis not present

## 2022-02-14 ENCOUNTER — Other Ambulatory Visit: Payer: Self-pay | Admitting: Gastroenterology

## 2022-02-14 ENCOUNTER — Encounter: Payer: Self-pay | Admitting: Gastroenterology

## 2022-02-15 ENCOUNTER — Other Ambulatory Visit: Payer: Self-pay

## 2022-02-15 ENCOUNTER — Other Ambulatory Visit: Payer: Self-pay | Admitting: Gastroenterology

## 2022-02-15 DIAGNOSIS — K648 Other hemorrhoids: Secondary | ICD-10-CM

## 2022-02-15 MED ORDER — HYDROCORTISONE ACETATE 25 MG RE SUPP: 25.0000 mg | Freq: Two times a day (BID) | RECTAL | 0 refills | Status: DC

## 2022-02-15 NOTE — Telephone Encounter (Signed)
Spoke with pt: Pt was notified that I would send in a prescription to last her till her office visit: Pt made aware: Pt verbalized understanding with all questions answered.

## 2022-02-21 ENCOUNTER — Encounter: Payer: Self-pay | Admitting: Gastroenterology

## 2022-02-21 ENCOUNTER — Ambulatory Visit: Payer: Medicare Other | Admitting: Gastroenterology

## 2022-02-21 ENCOUNTER — Ambulatory Visit: Payer: Medicare Other | Admitting: Nurse Practitioner

## 2022-02-21 VITALS — BP 118/74 | HR 70 | Ht 61.5 in | Wt 145.8 lb

## 2022-02-21 DIAGNOSIS — K219 Gastro-esophageal reflux disease without esophagitis: Secondary | ICD-10-CM

## 2022-02-21 DIAGNOSIS — K449 Diaphragmatic hernia without obstruction or gangrene: Secondary | ICD-10-CM | POA: Diagnosis not present

## 2022-02-21 DIAGNOSIS — K580 Irritable bowel syndrome with diarrhea: Secondary | ICD-10-CM | POA: Diagnosis not present

## 2022-02-21 MED ORDER — RABEPRAZOLE SODIUM 20 MG PO TBEC: 20.0000 mg | DELAYED_RELEASE_TABLET | Freq: Every day | ORAL | 6 refills | Status: DC

## 2022-02-21 NOTE — Patient Instructions (Addendum)
_______________________________________________________  If you are age 76 or older, your body mass index should be between 23-30. Your Body mass index is 27.1 kg/m. If this is out of the aforementioned range listed, please consider follow up with your Primary Care Provider.  If you are age 71 or younger, your body mass index should be between 19-25. Your Body mass index is 27.1 kg/m. If this is out of the aformentioned range listed, please consider follow up with your Primary Care Provider.   ________________________________________________________  The [place] [facility] providers would like to encourage you to use [facility] to communicate with providers for non-urgent requests or questions.  Due to long hold times on the telephone, sending your provider a message by [facility] may be a faster and more efficient way to get a response.  Please allow 48 business hours for a response.  Please remember that this is for non-urgent requests.  _______________________________________________________  Dennis Bast have been scheduled for an endoscopy. Please follow written instructions given to you at your visit today. If you use inhalers (even only as needed), please bring them with you on the day of your procedure.  We have sent the following medications to your pharmacy for you to pick up at your convenience: Aciphex '20mg'$  daily  Continue bentyl.  You have been scheduled for an abdominal ultrasound at [facility] (1st floor of hospital) on 02-27-2022 at 10am. Please arrive 15 minutes prior to your appointment for registration. Make certain not to have anything to eat or drink midnight prior to your appointment. Should you need to reschedule your appointment, please contact radiology at [phone]. This test typically takes about 30 minutes to perform.  Please call with any questions or concerns.  Thank you,  Dr. Jackquline Denmark

## 2022-02-21 NOTE — Progress Notes (Signed)
Chief Complaint: FU  Referring Provider:  Leeroy Cha,*      ASSESSMENT AND PLAN;   #1. Epi pain/atypical chest pains - CTA showing gastritis. Had diarrhea with protonix 20QD.  #2. IBS-D  with assoc lymphocytic colitis. Neg colon 10/2016 except for mild left colonic div and int hoids. Random colon Bx- lymphocytic colitis.  Responded well to Bentyl and taking her off NSAIDs. Neg CT abdo/pel 12/01/2016, CTA 12/2021  #3. GERD with dysphagia. Small HH on CT 11/2016.  Plan:  - Aciphex '20mg'$  po QD #30, 6 RF - EGD with dil - [place] abdo complete. - Continue dicyclomine for now - If still with problems, Carafate or GI cocktail  HPI:    Ashley Brennan is a 76 y.o. female   FU ED visit 01/12/2022 with epi/chest pain, MI r/o, Nl CBC, CMP, lipase. CTA neg except for gastritis.  She was given a trial of Protonix 20 mg p.o. daily.  Unfortunately it resulted in diarrhea.  She has stopped taking the medication.  She continues to have significant heartburn with with some dysphagia-mostly to solids getting hung up in the lower chest.  She denies having any dysphagia.  No recent antibiotics.  Here for further evaluation.  Denies having any significant diarrhea or constipation.  No jaundice dark urine or pale stools.  Wt Readings from Last 3 Encounters:  02/21/22 145 lb 12.8 oz (66.1 kg)  01/12/22 145 lb 4.5 oz (65.9 kg)  02/04/21 145 lb 4 oz (65.9 kg)     Past GI procedures:  CTA 01/13/2022: Negative except for gastritis.  No focal ulcers.  Diverticulosis without diverticulitis.  Normal vasculature.  Colonoscopy 11/03/2016 ([facility]): Mild left colonic diverticulosis, internal hemorrhoids, otherwise normal to TI. Bx-random colon biopsies positive for lymphocytic colitis. Remote history of EGD per patient.  Ashley Brennan's mom. Daughter in [place]. Mom passed away 2019/06/22 (dementia [place])  Past Medical History:  Diagnosis Date   Arthritis    Stated she get shots in her hands and  feet   Asthma    Back pain    Diverticulosis    Fecal incontinence    Flatulence    GERD (gastroesophageal reflux disease)    History of colon polyps    HTN (hypertension)    Hyperlipidemia    IBS (irritable bowel syndrome)    SCC (squamous cell carcinoma) 10/24/2016   RIGHT WRIST TX WITH BX   Seasonal allergies    Skin cancer    Squamous cell   Squamous cell carcinoma of skin 05/10/2008   LEFT FORNT SCALP CX3 5FU   Squamous cell carcinoma of skin 12/09/2019   right lower leg-anterior (CX35FU)   Squamous cell carcinoma of skin 12/09/2019   chest-medial center (observe/no tmt per ST)    Past Surgical History:  Procedure Laterality Date   ABDOMINAL HYSTERECTOMY     APPENDECTOMY     BACK SURGERY     CATARACT EXTRACTION, BILATERAL     COLONOSCOPY  11/03/2016   Diverticulosis of colon. Internal hemorrhoids   FOOT SURGERY Right    said they put her joint in her foot. [facility]   KNEE SURGERY      Family History  Problem Relation Age of Onset   Prostate cancer Father    Lung cancer Father    Colon cancer Neg Hx    Esophageal cancer Neg Hx     Social History   Tobacco Use   Smoking status: Former   Smokeless tobacco: Never  Vaping  Use   Vaping Use: Never used  Substance Use Topics   Alcohol use: Yes    Comment: glass of wine with tonic   Drug use: Not Currently    Current Outpatient Medications  Medication Sig Dispense Refill   Acetaminophen (TYLENOL PO) Take 1 tablet by mouth as needed.     amLODipine (NORVASC) 5 MG tablet Take 5 mg by mouth daily.     atorvastatin (LIPITOR) 20 MG tablet Take 20 mg by mouth daily.     Calcium Carbonate Antacid (TUMS PO) Take by mouth daily.     COLLAGEN PO Take by mouth daily.     dicyclomine (BENTYL) 10 MG capsule Take 1 capsule (10 mg total) by mouth in the morning, at noon, in the evening, and at bedtime. 360 capsule 3   fluticasone (CUTIVATE) 0.05 % cream Apply topically 2 (two) times daily. X 10 days 30 g 2    hydrocortisone (ANUSOL-HC) 25 MG suppository Place 1 suppository (25 mg total) rectally 2 (two) times daily. For 10 days to rectum and then as needed 20 suppository 0   losartan (COZAAR) 50 MG tablet Take 50 mg by mouth daily.     pantoprazole (PROTONIX) 20 MG tablet Take 1 tablet (20 mg total) by mouth daily. 30 tablet 1   Polyethyl Glycol-Propyl Glycol (SYSTANE) 0.4-0.3 % GEL ophthalmic gel Apply 1 application to eye as needed.      sucralfate (CARAFATE) 1 g tablet Take 1 tablet (1 g total) by mouth 4 (four) times daily as needed. 30 tablet 0   No current facility-administered medications for this visit.    Allergies  Allergen Reactions   Omeprazole Other (See Comments)    Review of Systems:  neg     Physical Exam:    BP 118/74   Pulse 70   Ht 5' 1.5" (1.562 m)   Wt 145 lb 12.8 oz (66.1 kg)   BMI 27.10 kg/m  Filed Weights   02/21/22 1336  Weight: 145 lb 12.8 oz (66.1 kg)   Constitutional:  Well-developed, in no acute distress. Psychiatric: Normal mood and affect. Behavior is normal. HEENT: No scleral icterus. Cardiovascular: Normal rate, regular rhythm. No edema Pulmonary/chest: Effort normal and breath sounds normal. No wheezing, rales or rhonchi. Abdominal: Soft, nondistended.  Epi tenderness. Bowel sounds active throughout. There are no masses palpable. No hepatomegaly. Neurological: Alert and oriented to person place and time. Skin: Skin is warm and dry. No rashes noted.  Data Reviewed: I have personally reviewed following labs and imaging studies  CBC:    Latest Ref Rng & Units 01/12/2022    9:22 PM 08/19/2009   12:54 PM  CBC  WBC 4.0 - 10.5 K/uL 6.7  6.1   Hemoglobin 12.0 - 15.0 g/dL 15.3  14.8   Hematocrit 36.0 - 46.0 % 46.8  43.2   Platelets 150 - 400 K/uL 266  325     CMP:    Latest Ref Rng & Units 01/13/2022   12:03 AM 01/12/2022    9:22 PM 08/19/2009   12:54 PM  CMP  Glucose 70 - 99 mg/dL  105  99   BUN 8 - 23 mg/dL  17  11   Creatinine 0.44 -  1.00 mg/dL  0.87  1.10   Sodium 135 - 145 mmol/L  142  140   Potassium 3.5 - 5.1 mmol/L  3.7  3.8   Chloride 98 - 111 mmol/L  102  100   CO2 22 - 32  mmol/L  28  33   Calcium 8.9 - 10.3 mg/dL  10.0  9.5   Total Protein 6.5 - 8.1 g/dL 6.6     Total Bilirubin 0.3 - 1.2 mg/dL 0.4     Alkaline Phos 38 - 126 U/L 55     AST 15 - 41 U/L 14     ALT 0 - 44 U/L 18          Carmell Austria, MD 02/21/2022, 1:47 PM  Cc: Leeroy Cha,*

## 2022-02-27 ENCOUNTER — Ambulatory Visit (HOSPITAL_COMMUNITY)
Admission: RE | Admit: 2022-02-27 | Discharge: 2022-02-27 | Disposition: A | Discharge status: Home / Self Care | Payer: Medicare Other | Source: Ambulatory Visit | Attending: Gastroenterology | Admitting: Gastroenterology

## 2022-02-27 DIAGNOSIS — K449 Diaphragmatic hernia without obstruction or gangrene: Secondary | ICD-10-CM | POA: Diagnosis not present

## 2022-02-27 DIAGNOSIS — K219 Gastro-esophageal reflux disease without esophagitis: Secondary | ICD-10-CM | POA: Diagnosis not present

## 2022-02-27 DIAGNOSIS — K580 Irritable bowel syndrome with diarrhea: Secondary | ICD-10-CM | POA: Insufficient documentation

## 2022-02-27 DIAGNOSIS — K589 Irritable bowel syndrome without diarrhea: Secondary | ICD-10-CM | POA: Diagnosis not present

## 2022-03-14 DIAGNOSIS — M180 Bilateral primary osteoarthritis of first carpometacarpal joints: Secondary | ICD-10-CM | POA: Diagnosis not present

## 2022-03-16 ENCOUNTER — Telehealth: Payer: Self-pay | Admitting: Gastroenterology

## 2022-03-16 NOTE — Telephone Encounter (Signed)
Error

## 2022-03-17 ENCOUNTER — Encounter: Payer: Self-pay | Admitting: Gastroenterology

## 2022-03-27 ENCOUNTER — Encounter: Payer: Self-pay | Admitting: Gastroenterology

## 2022-03-27 ENCOUNTER — Telehealth: Payer: Self-pay | Admitting: Gastroenterology

## 2022-03-27 NOTE — Telephone Encounter (Signed)
Patient returned call, to discuss symptoms left on patient message. Stated she is having diarrhea, would like nurse to return call. Please advise.   Patient also stated her procedure is next week and she had yet to receive a call to add her new insurance. I have added her insurance and staff message sent to [facility] department to proceed with procedure authorization.

## 2022-03-28 DIAGNOSIS — L570 Actinic keratosis: Secondary | ICD-10-CM | POA: Diagnosis not present

## 2022-03-28 NOTE — Telephone Encounter (Signed)
Patient states she is returning call. Please advise.

## 2022-03-28 NOTE — Telephone Encounter (Signed)
Pt stated that she is having diarrhea daily and has a constant stomach ache that make her miserable: : Pt stated that she is taking the dicyclomine as prescribed: Pt stated that she has already had 5 diarrhea BM's this AM:  Pt stated that she "was prescribed something in the past that helped, some kind of powder that she mixed up"  could not recall the name: Please advise

## 2022-03-29 NOTE — Telephone Encounter (Signed)
Spoke with Pt. Chart reviewed. Noted that I had spoke with pt yesterday and Phone note sent to Dr. Lyndel Safe: Pt made aware that as soon as Dr. Lyndel Safe makes his recommendations then I will contact pt: Pt verbalized understanding with all questions answered.

## 2022-03-31 ENCOUNTER — Encounter: Payer: Self-pay | Admitting: Gastroenterology

## 2022-04-02 NOTE — Telephone Encounter (Signed)
She has been on cholestyramine before Lets start cholestyramine 4 g p.o. daily.  Take it 2 hours before or after rest of the medications Let us know how she is in 2 weeks. If she still has problems, check stool studies for GI pathogens, calprotectin.  In that case, she may need a trial of budesonide. RG

## 2022-04-02 NOTE — Telephone Encounter (Signed)
Cholestyramine 4 g p.o. daily, 2 hours before or after rest of the medications #30, 6RF RG

## 2022-04-03 ENCOUNTER — Other Ambulatory Visit: Payer: Self-pay

## 2022-04-03 DIAGNOSIS — R197 Diarrhea, unspecified: Secondary | ICD-10-CM

## 2022-04-03 MED ORDER — CHOLESTYRAMINE 4 G PO PACK: 4.0000 g | PACK | Freq: Every day | ORAL | 6 refills | Status: DC

## 2022-04-03 NOTE — Telephone Encounter (Signed)
Pt made aware of Dr. Lyndel Safe recommendations: Prescription sent to pharmacy: Pt make aware.  Pt notified to contact us in 2 weeks with a symptom update:  Pt verbalized understanding with all questions answered.

## 2022-04-04 ENCOUNTER — Ambulatory Visit: Payer: Medicare HMO | Admitting: Gastroenterology

## 2022-04-04 ENCOUNTER — Encounter: Payer: Self-pay | Admitting: Gastroenterology

## 2022-04-04 VITALS — BP 155/78 | HR 62 | Temp 97.3°F | Resp 11 | Ht 61.0 in | Wt 145.0 lb

## 2022-04-04 DIAGNOSIS — I10 Essential (primary) hypertension: Secondary | ICD-10-CM | POA: Diagnosis not present

## 2022-04-04 DIAGNOSIS — K449 Diaphragmatic hernia without obstruction or gangrene: Secondary | ICD-10-CM

## 2022-04-04 DIAGNOSIS — K2950 Unspecified chronic gastritis without bleeding: Secondary | ICD-10-CM

## 2022-04-04 DIAGNOSIS — K219 Gastro-esophageal reflux disease without esophagitis: Secondary | ICD-10-CM

## 2022-04-04 DIAGNOSIS — R1310 Dysphagia, unspecified: Secondary | ICD-10-CM | POA: Diagnosis not present

## 2022-04-04 DIAGNOSIS — R197 Diarrhea, unspecified: Secondary | ICD-10-CM

## 2022-04-04 DIAGNOSIS — K580 Irritable bowel syndrome with diarrhea: Secondary | ICD-10-CM

## 2022-04-04 MED ORDER — SODIUM CHLORIDE 0.9 % IV SOLN: 500.0000 mL | INTRAVENOUS | Status: DC

## 2022-04-04 NOTE — Progress Notes (Signed)
Chief Complaint: FU  Referring Provider:  Leeroy Cha,*      ASSESSMENT AND PLAN;   #1. Epi pain/atypical chest pains - CTA showing gastritis. Had diarrhea with protonix 20QD.  #2. IBS-D  with assoc lymphocytic colitis. Neg colon 10/2016 except for mild left colonic div and int hoids. Random colon Bx- lymphocytic colitis.  Responded well to Bentyl and taking her off NSAIDs. Neg CT abdo/pel 12/01/2016, CTA 12/2021  #3. GERD with dysphagia. Small HH on CT 11/2016.  Plan:  - Aciphex '20mg'$  po QD #30, 6 RF - EGD with dil - [place] abdo complete. - Continue dicyclomine for now - If still with problems, Carafate or GI cocktail  HPI:    Ashley Brennan is a 77 y.o. female   FU ED visit 01/12/2022 with epi/chest pain, MI r/o, Nl CBC, CMP, lipase. CTA neg except for gastritis.  She was given a trial of Protonix 20 mg p.o. daily.  Unfortunately it resulted in diarrhea.  She has stopped taking the medication.  She continues to have significant heartburn with with some dysphagia-mostly to solids getting hung up in the lower chest.  She denies having any dysphagia.  No recent antibiotics.  Here for further evaluation.  Denies having any significant diarrhea or constipation.  No jaundice dark urine or pale stools.  Wt Readings from Last 3 Encounters:  04/04/22 145 lb (65.8 kg)  02/21/22 145 lb 12.8 oz (66.1 kg)  01/12/22 145 lb 4.5 oz (65.9 kg)     Past GI procedures:  CTA 01/13/2022: Negative except for gastritis.  No focal ulcers.  Diverticulosis without diverticulitis.  Normal vasculature.  Colonoscopy 11/03/2016 ([facility]): Mild left colonic diverticulosis, internal hemorrhoids, otherwise normal to TI. Bx-random colon biopsies positive for lymphocytic colitis. Remote history of EGD per patient.  Vail Stankus's mom. Daughter in [place]. Mom passed away 06/30/2019 (dementia [place])  Past Medical History:  Diagnosis Date   Arthritis    Stated she get shots in her hands and feet    Asthma    Back pain    Diverticulosis    Fecal incontinence    Flatulence    GERD (gastroesophageal reflux disease)    History of colon polyps    HTN (hypertension)    Hyperlipidemia    IBS (irritable bowel syndrome)    SCC (squamous cell carcinoma) 10/24/2016   RIGHT WRIST TX WITH BX   Seasonal allergies    Skin cancer    Squamous cell   Squamous cell carcinoma of skin 05/10/2008   LEFT FORNT SCALP CX3 5FU   Squamous cell carcinoma of skin 12/09/2019   right lower leg-anterior (CX35FU)   Squamous cell carcinoma of skin 12/09/2019   [facility] (observe/no tmt per [facility])    Past Surgical History:  Procedure Laterality Date   ABDOMINAL HYSTERECTOMY     APPENDECTOMY     BACK SURGERY     CATARACT EXTRACTION, BILATERAL     COLONOSCOPY  11/03/2016   Diverticulosis of colon. Internal hemorrhoids   FOOT SURGERY Right    said they put her joint in her foot. 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     amLODipine (NORVASC) 5 MG tablet Take 5 mg by mouth daily.     atorvastatin (LIPITOR) 20 MG tablet Take 20 mg by mouth daily.     Calcium Carbonate Antacid (TUMS PO) Take by mouth daily.     cholestyramine (QUESTRAN) 4 g packet Take 1 packet (4 g total) by mouth daily. Take two hours before or after rest of your medications: 30 each 6   COLLAGEN PO Take by mouth daily.     dicyclomine (BENTYL) 10 MG capsule Take 1 capsule (10 mg total) by mouth in the morning, at noon, in the  evening, and at bedtime. 360 capsule 3   fluticasone (CUTIVATE) 0.05 % cream Apply topically 2 (two) times daily. X 10 days 30 g 2   hydrocortisone (ANUSOL-HC) 25 MG suppository Place 1 suppository (25 mg total) rectally 2 (two) times daily. For 10 days to rectum and then as needed 20 suppository 0   losartan (COZAAR) 50 MG tablet Take 50 mg by mouth daily.     Polyethyl Glycol-Propyl Glycol (SYSTANE) 0.4-0.3 % GEL ophthalmic gel Apply 1 application to eye as needed.      RABEprazole (ACIPHEX) 20 MG tablet Take 1 tablet (20 mg total) by mouth daily. 30 tablet 6   sucralfate (CARAFATE) 1 g tablet Take 1 tablet (1 g total) by mouth 4 (four) times daily as needed. 30 tablet 0   Current Facility-Administered Medications  Medication Dose Route Frequency Provider Last Rate Last Admin   0.9 %  sodium chloride infusion  500 mL Intravenous Continuous Jackquline Denmark, MD        Allergies  Allergen Reactions   Omeprazole Other (See Comments)    Review of Systems:  neg     Physical Exam:    BP (!) 144/97   Pulse 63   Temp (!) 97.3 F (36.3 C) (Skin)   Ht '5\' 1"'$  (1.549 m)   Wt 145 lb (65.8 kg)   SpO2 99%   BMI 27.40 kg/m  Filed Weights   04/04/22 1445  Weight: 145 lb (65.8 kg)   Constitutional:  Well-developed, in no acute distress. Psychiatric: Normal mood and affect. Behavior is normal. HEENT: No scleral icterus. Cardiovascular: Normal rate, regular rhythm. No edema Pulmonary/chest: Effort normal and breath sounds normal. No wheezing, rales or rhonchi. Abdominal: Soft, nondistended.  Epi tenderness. Bowel sounds active throughout. There are no masses palpable. No hepatomegaly. Neurological: Alert and oriented to person place and time. Skin: Skin is warm and dry. No rashes noted.  Data Reviewed: I have personally reviewed following labs and imaging studies  CBC:    Latest Ref Rng & Units 01/12/2022    9:22 PM 08/19/2009   12:54 PM  CBC  WBC 4.0 - 10.5 K/uL 6.7  6.1   Hemoglobin  12.0 - 15.0 g/dL 15.3  14.8   Hematocrit 36.0 - 46.0 % 46.8  43.2   Platelets 150 - 400 K/uL 266  325     CMP:    Latest Ref Rng & Units 01/13/2022   12:03 AM 01/12/2022    9:22 PM 08/19/2009   12:54 PM  CMP  Glucose 70 - 99 mg/dL  105  99   BUN 8 - 23 mg/dL  17  11   Creatinine 0.44 - 1.00 mg/dL  0.87  1.10   Sodium 135 - 145 mmol/L  142  140   Potassium 3.5 - 5.1 mmol/L  3.7  3.8   Chloride 98 - 111 mmol/L  102  100   CO2 22 - 32 mmol/L  28  33   Calcium 8.9 - 10.3 mg/dL  10.0  9.5   Total Protein 6.5 - 8.1 g/dL 6.6     Total Bilirubin 0.3 - 1.2 mg/dL 0.4     Alkaline Phos 38 - 126 U/L 55     AST 15 - 41 U/L 14     ALT 0 - 44 U/L 18          Carmell Austria, MD 04/04/2022, 2:56 PM  Cc: Leeroy Cha,*

## 2022-04-04 NOTE — Op Note (Signed)
[facility] Patient Name: Ashley Brennan Procedure Date: 04/04/2022 3:11 PM MRN: [id number] Endoscopist: Jackquline Denmark , MD, [id number] Age: 77 Referring MD:  Date of Birth: 12/22/1945 Gender: Female Account #: [id number] Procedure:                Upper GI endoscopy Indications:              Dysphagia Medicines:                Monitored Anesthesia Care Procedure:                Pre-Anesthesia Assessment:                           - Prior to the procedure, a History and Physical                            was performed, and patient medications and                            allergies were reviewed. The patient's tolerance of                            previous anesthesia was also reviewed. The risks                            and benefits of the procedure and the sedation                            options and risks were discussed with the patient.                            All questions were answered, and informed consent                            was obtained. Prior Anticoagulants: The patient has                            taken no anticoagulant or antiplatelet agents. ASA                            Grade Assessment: II - A patient with mild systemic                            disease. After reviewing the risks and benefits,                            the patient was deemed in satisfactory condition to                            undergo the procedure.                           After obtaining informed consent, the endoscope was  passed under direct vision. Throughout the                            procedure, the patient's blood pressure, pulse, and                            oxygen saturations were monitored continuously. The                            GIF [id number] #[id number] was introduced through the                            mouth, and advanced to the second part of duodenum.                            The upper GI endoscopy was accomplished without                             difficulty. The patient tolerated the procedure                            well. Scope In: Scope Out: Findings:                 The examined esophagus was normal with well-defined                            Z-line at 35 cm. Biopsies were obtained from the                            proximal and distal esophagus with cold forceps for                            histology to r/o eosinophilic esophagitis. The                            scope was withdrawn. Dilation was performed with a                            Maloney dilator with mild resistance at 50 Fr and                            52 Fr.                           A small hiatal hernia was present.                           Localized mild inflammation characterized by                            erythema was found in the gastric antrum. Biopsies                            were taken with a  cold forceps for histology.                           The examined duodenum was normal. Biopsies for                            histology were taken with a cold forceps for                            evaluation of celiac disease. Complications:            No immediate complications. Estimated Blood Loss:     Estimated blood loss: none. Impression:               - Small hiatal hernia.                           - Mild gastritis. Biopsied.                           - S/P esophageal dilatation. Recommendation:           - Patient has a contact number available for                            emergencies. The signs and symptoms of potential                            delayed complications were discussed with the                            patient. Return to normal activities tomorrow.                            Written discharge instructions were provided to the                            patient.                           - Post dilatation diet.                           - Continue present medications including                             rabeprazole 20 mg p.o. daily.                           - Await pathology results.                           - Start cholestyramine 4 g p.o. daily as already                            prescribed. If still with diarrhea, please let us  know.                           - The findings and recommendations were discussed                            with the patient's family. Jackquline Denmark, MD 04/04/2022 3:25:57 PM This report has been signed electronically.

## 2022-04-04 NOTE — Patient Instructions (Addendum)
Dr Lyndel Safe stated that you may use tums and pepto bismol for relief of stomach pain.  He stated that you could have some chest pain tonight due to the stretching of your esophagus. If it gets worse after the tums etc do call the number on your discharge paper or go to the ER.  Dr Lyndel Safe would like for you to start your cholestyramine as already prescribed.  Please, let us know if you still have diarrhea.    You may also want to keep a food diary.  Avoid spicy and acidic foods  YOU HAD AN ENDOSCOPIC PROCEDURE TODAY AT [facility]:   Refer to the procedure report that was given to you for any specific questions about what was found during the examination.  If the procedure report does not answer your questions, please call your gastroenterologist to clarify.  If you requested that your care partner not be given the details of your procedure findings, then the procedure report has been included in a sealed envelope for you to review at your convenience later.  YOU SHOULD EXPECT: Some feelings of bloating in the abdomen. Passage of more gas than usual.  Walking can help get rid of the air that was put into your GI tract during the procedure and reduce the bloating.   Please Note:  You might notice some irritation and congestion in your nose or some drainage.  This is from the oxygen used during your procedure.  There is no need for concern and it should clear up in a day or so.  SYMPTOMS TO REPORT IMMEDIATELY:   Following upper endoscopy (EGD)  Vomiting of blood or coffee ground material  New chest pain or pain under the shoulder blades  Painful or persistently difficult swallowing  New shortness of breath  Fever of 100F or higher  Black, tarry-looking stools  For urgent or emergent issues, a gastroenterologist can be reached at any hour by calling [phone]. Do not use MyChart messaging for urgent concerns.    DIET:  We do recommend nothing by mouth until 4:30 pm.  Then  clear liquids until 5:30 pm.  A soft diet is suggested for the rest of tonight.  Then,  you may proceed to your regular diet.excluding spicy foods and acidic foods tomorrow.  Drink plenty of fluids but you should avoid alcoholic beverages for 24 hours.  ACTIVITY:  You should plan to take it easy for the rest of today and you should NOT DRIVE or use heavy machinery until tomorrow (because of the sedation medicines used during the test).    FOLLOW UP: Our staff will call the number listed on your records the next business day following your procedure.  We will call around 7:15- 8:00 am to check on you and address any questions or concerns that you may have regarding the information given to you following your procedure. If we do not reach you, we will leave a message.     If any biopsies were taken you will be contacted by phone or by letter within the next 1-3 weeks.  Please call us at [phone] if you have not heard about the biopsies in 3 weeks.    SIGNATURES/CONFIDENTIALITY: You and/or your care partner have signed paperwork which will be entered into your electronic medical record.  These signatures attest to the fact that that the information above on your After Visit Summary has been reviewed and is understood.  Full responsibility of the confidentiality of this  discharge information lies with you and/or your care-partner.

## 2022-04-04 NOTE — Progress Notes (Signed)
Called to room to assist during endoscopic procedure.  Patient ID and intended procedure confirmed with present staff. Received instructions for my participation in the procedure from the performing physician.  

## 2022-04-04 NOTE — Progress Notes (Signed)
I have reviewed the patient's medical history in detail and updated the computerized patient record.

## 2022-04-05 ENCOUNTER — Telehealth: Payer: Self-pay | Admitting: *Deleted

## 2022-04-05 NOTE — Telephone Encounter (Signed)
  Follow up Call-     04/04/2022    2:45 PM  Call back number  Post procedure Call Back phone  # [phone]  Permission to leave phone message Yes     Patient questions:  Do you have a fever, pain , or abdominal swelling? No. Pain Score  0 *  Have you tolerated food without any problems? Yes.    Have you been able to return to your normal activities? Yes.    Do you have any questions about your discharge instructions: Diet   No. Medications  No. Follow up visit  No.  Do you have questions or concerns about your Care? No.  Actions: * If pain score is 4 or above: No action needed, pain <4.

## 2022-04-13 DIAGNOSIS — M180 Bilateral primary osteoarthritis of first carpometacarpal joints: Secondary | ICD-10-CM | POA: Diagnosis not present

## 2022-04-17 ENCOUNTER — Encounter: Payer: Self-pay | Admitting: Gastroenterology

## 2022-04-18 ENCOUNTER — Other Ambulatory Visit: Payer: Self-pay

## 2022-04-18 DIAGNOSIS — K648 Other hemorrhoids: Secondary | ICD-10-CM

## 2022-04-18 DIAGNOSIS — R197 Diarrhea, unspecified: Secondary | ICD-10-CM

## 2022-04-18 MED ORDER — HYDROCORTISONE ACETATE 25 MG RE SUPP: 25.0000 mg | Freq: Two times a day (BID) | RECTAL | 0 refills | Status: DC

## 2022-04-18 NOTE — Telephone Encounter (Signed)
Pt stated that she is still having the diarrhea after starting the Cholestyramine. Pt stated that it was not as bad as it was but it is still about 5-7 every day. Chart reviewed. Noted in recent documentation If she still has problems, check stool studies for GI pathogens, calprotectin.  Orders for labs placed: Pt made aware. Location to lab provided: Pt verbalized understanding with all questions answered.

## 2022-04-18 NOTE — Telephone Encounter (Signed)
Inbound call from patient, states she is still having diarrhea, sees no improvement. Is requesting follow up call to discuss further options. Please advise.

## 2022-04-19 ENCOUNTER — Other Ambulatory Visit: Payer: Medicare HMO

## 2022-04-21 ENCOUNTER — Other Ambulatory Visit: Payer: Medicare HMO

## 2022-04-21 DIAGNOSIS — R197 Diarrhea, unspecified: Secondary | ICD-10-CM | POA: Diagnosis not present

## 2022-04-25 LAB — GI PROFILE, STOOL, PCR

## 2022-04-26 LAB — CALPROTECTIN, FECAL: Calprotectin, Fecal: 44 ug/g (ref 0–120)

## 2022-06-08 DIAGNOSIS — Z79899 Other long term (current) drug therapy: Secondary | ICD-10-CM | POA: Diagnosis not present

## 2022-06-08 DIAGNOSIS — I8393 Asymptomatic varicose veins of bilateral lower extremities: Secondary | ICD-10-CM | POA: Diagnosis not present

## 2022-06-08 DIAGNOSIS — I700 Atherosclerosis of aorta: Secondary | ICD-10-CM | POA: Diagnosis not present

## 2022-06-08 DIAGNOSIS — M47816 Spondylosis without myelopathy or radiculopathy, lumbar region: Secondary | ICD-10-CM | POA: Diagnosis not present

## 2022-06-08 DIAGNOSIS — K449 Diaphragmatic hernia without obstruction or gangrene: Secondary | ICD-10-CM | POA: Diagnosis not present

## 2022-06-08 DIAGNOSIS — I10 Essential (primary) hypertension: Secondary | ICD-10-CM | POA: Diagnosis not present

## 2022-06-16 ENCOUNTER — Encounter: Payer: Self-pay | Admitting: Gastroenterology

## 2022-06-22 ENCOUNTER — Other Ambulatory Visit: Payer: Self-pay

## 2022-06-22 MED ORDER — BUDESONIDE 3 MG PO CPEP: ORAL_CAPSULE | ORAL | 0 refills | Status: AC

## 2022-06-22 NOTE — Telephone Encounter (Signed)
She has microscopic colitis -Start budesonide 9mg  po qd x 8 weeks, then if better, 6mg  for 2 weeks followed by 3 mg for another 2 weeks. If not better, please call. -Avoid NSAIDs. -Can use imodium AD on as needed basis. RG

## 2022-06-27 DIAGNOSIS — M9904 Segmental and somatic dysfunction of sacral region: Secondary | ICD-10-CM | POA: Diagnosis not present

## 2022-06-27 DIAGNOSIS — M9903 Segmental and somatic dysfunction of lumbar region: Secondary | ICD-10-CM | POA: Diagnosis not present

## 2022-06-27 DIAGNOSIS — M9905 Segmental and somatic dysfunction of pelvic region: Secondary | ICD-10-CM | POA: Diagnosis not present

## 2022-06-27 DIAGNOSIS — M5136 Other intervertebral disc degeneration, lumbar region: Secondary | ICD-10-CM | POA: Diagnosis not present

## 2022-06-28 DIAGNOSIS — B370 Candidal stomatitis: Secondary | ICD-10-CM | POA: Diagnosis not present

## 2022-06-28 DIAGNOSIS — M5136 Other intervertebral disc degeneration, lumbar region: Secondary | ICD-10-CM | POA: Diagnosis not present

## 2022-06-28 DIAGNOSIS — M9905 Segmental and somatic dysfunction of pelvic region: Secondary | ICD-10-CM | POA: Diagnosis not present

## 2022-06-28 DIAGNOSIS — M9903 Segmental and somatic dysfunction of lumbar region: Secondary | ICD-10-CM | POA: Diagnosis not present

## 2022-06-28 DIAGNOSIS — M9904 Segmental and somatic dysfunction of sacral region: Secondary | ICD-10-CM | POA: Diagnosis not present

## 2022-07-06 NOTE — Telephone Encounter (Signed)
lets go down to  budesonide 6 mg (2 pills)

## 2022-07-18 DIAGNOSIS — B370 Candidal stomatitis: Secondary | ICD-10-CM | POA: Diagnosis not present

## 2022-07-18 DIAGNOSIS — K580 Irritable bowel syndrome with diarrhea: Secondary | ICD-10-CM | POA: Diagnosis not present

## 2022-07-19 ENCOUNTER — Ambulatory Visit
Admission: RE | Admit: 2022-07-19 | Discharge: 2022-07-19 | Disposition: A | Discharge status: Home / Self Care | Payer: Medicare HMO | Source: Ambulatory Visit | Attending: Internal Medicine | Admitting: Internal Medicine

## 2022-07-19 DIAGNOSIS — M9903 Segmental and somatic dysfunction of lumbar region: Secondary | ICD-10-CM | POA: Diagnosis not present

## 2022-07-19 DIAGNOSIS — E2839 Other primary ovarian failure: Secondary | ICD-10-CM

## 2022-07-19 DIAGNOSIS — M9904 Segmental and somatic dysfunction of sacral region: Secondary | ICD-10-CM | POA: Diagnosis not present

## 2022-07-19 DIAGNOSIS — M5136 Other intervertebral disc degeneration, lumbar region: Secondary | ICD-10-CM | POA: Diagnosis not present

## 2022-07-19 DIAGNOSIS — M9905 Segmental and somatic dysfunction of pelvic region: Secondary | ICD-10-CM | POA: Diagnosis not present

## 2022-07-19 DIAGNOSIS — Z13820 Encounter for screening for osteoporosis: Secondary | ICD-10-CM | POA: Diagnosis not present

## 2022-07-20 NOTE — Telephone Encounter (Signed)
Lets go down on 1/day of budesonide (3mg  po QD) until she returns from cruise And find at cruise RG

## 2022-09-04 DIAGNOSIS — D2371 Other benign neoplasm of skin of right lower limb, including hip: Secondary | ICD-10-CM | POA: Diagnosis not present

## 2022-09-04 DIAGNOSIS — D692 Other nonthrombocytopenic purpura: Secondary | ICD-10-CM | POA: Diagnosis not present

## 2022-09-04 DIAGNOSIS — L821 Other seborrheic keratosis: Secondary | ICD-10-CM | POA: Diagnosis not present

## 2022-09-04 DIAGNOSIS — L720 Epidermal cyst: Secondary | ICD-10-CM | POA: Diagnosis not present

## 2022-09-04 DIAGNOSIS — I788 Other diseases of capillaries: Secondary | ICD-10-CM | POA: Diagnosis not present

## 2022-09-04 DIAGNOSIS — D044 Carcinoma in situ of skin of scalp and neck: Secondary | ICD-10-CM | POA: Diagnosis not present

## 2022-09-04 DIAGNOSIS — Z85828 Personal history of other malignant neoplasm of skin: Secondary | ICD-10-CM | POA: Diagnosis not present

## 2022-09-04 DIAGNOSIS — D485 Neoplasm of uncertain behavior of skin: Secondary | ICD-10-CM | POA: Diagnosis not present

## 2022-09-04 DIAGNOSIS — D1801 Hemangioma of skin and subcutaneous tissue: Secondary | ICD-10-CM | POA: Diagnosis not present

## 2022-09-04 DIAGNOSIS — L570 Actinic keratosis: Secondary | ICD-10-CM | POA: Diagnosis not present

## 2022-09-04 DIAGNOSIS — L814 Other melanin hyperpigmentation: Secondary | ICD-10-CM | POA: Diagnosis not present

## 2022-09-11 DIAGNOSIS — M180 Bilateral primary osteoarthritis of first carpometacarpal joints: Secondary | ICD-10-CM | POA: Diagnosis not present

## 2022-10-04 DIAGNOSIS — M9905 Segmental and somatic dysfunction of pelvic region: Secondary | ICD-10-CM | POA: Diagnosis not present

## 2022-10-04 DIAGNOSIS — M9903 Segmental and somatic dysfunction of lumbar region: Secondary | ICD-10-CM | POA: Diagnosis not present

## 2022-10-04 DIAGNOSIS — M5136 Other intervertebral disc degeneration, lumbar region: Secondary | ICD-10-CM | POA: Diagnosis not present

## 2022-10-04 DIAGNOSIS — M9904 Segmental and somatic dysfunction of sacral region: Secondary | ICD-10-CM | POA: Diagnosis not present

## 2022-10-05 DIAGNOSIS — M5136 Other intervertebral disc degeneration, lumbar region: Secondary | ICD-10-CM | POA: Diagnosis not present

## 2022-10-05 DIAGNOSIS — M9903 Segmental and somatic dysfunction of lumbar region: Secondary | ICD-10-CM | POA: Diagnosis not present

## 2022-10-05 DIAGNOSIS — M9905 Segmental and somatic dysfunction of pelvic region: Secondary | ICD-10-CM | POA: Diagnosis not present

## 2022-10-05 DIAGNOSIS — M9904 Segmental and somatic dysfunction of sacral region: Secondary | ICD-10-CM | POA: Diagnosis not present

## 2022-10-10 DIAGNOSIS — M9904 Segmental and somatic dysfunction of sacral region: Secondary | ICD-10-CM | POA: Diagnosis not present

## 2022-10-10 DIAGNOSIS — M9903 Segmental and somatic dysfunction of lumbar region: Secondary | ICD-10-CM | POA: Diagnosis not present

## 2022-10-10 DIAGNOSIS — M5136 Other intervertebral disc degeneration, lumbar region: Secondary | ICD-10-CM | POA: Diagnosis not present

## 2022-10-10 DIAGNOSIS — M9905 Segmental and somatic dysfunction of pelvic region: Secondary | ICD-10-CM | POA: Diagnosis not present

## 2022-10-12 DIAGNOSIS — M5416 Radiculopathy, lumbar region: Secondary | ICD-10-CM | POA: Diagnosis not present

## 2022-10-13 DIAGNOSIS — H04123 Dry eye syndrome of bilateral lacrimal glands: Secondary | ICD-10-CM | POA: Diagnosis not present

## 2022-10-13 DIAGNOSIS — H52203 Unspecified astigmatism, bilateral: Secondary | ICD-10-CM | POA: Diagnosis not present

## 2022-10-13 DIAGNOSIS — Z961 Presence of intraocular lens: Secondary | ICD-10-CM | POA: Diagnosis not present

## 2022-10-24 DIAGNOSIS — M5136 Other intervertebral disc degeneration, lumbar region: Secondary | ICD-10-CM | POA: Diagnosis not present

## 2022-10-24 DIAGNOSIS — M47816 Spondylosis without myelopathy or radiculopathy, lumbar region: Secondary | ICD-10-CM | POA: Diagnosis not present

## 2022-10-24 DIAGNOSIS — M5137 Other intervertebral disc degeneration, lumbosacral region: Secondary | ICD-10-CM | POA: Diagnosis not present

## 2022-10-24 DIAGNOSIS — M5127 Other intervertebral disc displacement, lumbosacral region: Secondary | ICD-10-CM | POA: Diagnosis not present

## 2022-10-24 DIAGNOSIS — M5416 Radiculopathy, lumbar region: Secondary | ICD-10-CM | POA: Diagnosis not present

## 2022-11-03 ENCOUNTER — Other Ambulatory Visit: Payer: Medicare HMO

## 2022-11-03 ENCOUNTER — Encounter: Payer: Self-pay | Admitting: Gastroenterology

## 2022-11-03 ENCOUNTER — Ambulatory Visit: Payer: Medicare HMO | Admitting: Gastroenterology

## 2022-11-03 VITALS — BP 126/80 | HR 71 | Ht 62.0 in | Wt 142.0 lb

## 2022-11-03 DIAGNOSIS — K219 Gastro-esophageal reflux disease without esophagitis: Secondary | ICD-10-CM

## 2022-11-03 DIAGNOSIS — R1013 Epigastric pain: Secondary | ICD-10-CM | POA: Diagnosis not present

## 2022-11-03 DIAGNOSIS — R0789 Other chest pain: Secondary | ICD-10-CM | POA: Diagnosis not present

## 2022-11-03 DIAGNOSIS — K580 Irritable bowel syndrome with diarrhea: Secondary | ICD-10-CM | POA: Diagnosis not present

## 2022-11-03 DIAGNOSIS — K449 Diaphragmatic hernia without obstruction or gangrene: Secondary | ICD-10-CM

## 2022-11-03 MED ORDER — FAMOTIDINE 20 MG PO TABS: 20.0000 mg | ORAL_TABLET | Freq: Two times a day (BID) | ORAL | 3 refills | Status: DC

## 2022-11-03 MED ORDER — CHOLESTYRAMINE 4 G PO PACK: 4.0000 g | PACK | Freq: Two times a day (BID) | ORAL | 12 refills | Status: DC

## 2022-11-03 NOTE — Progress Notes (Signed)
Chief Complaint: FU  Referring Provider:  [name],*      ASSESSMENT AND PLAN;   #1. Epi pain/atypical chest pains - CTA showing gastritis. Had diarrhea with protonix 20QD.  #2. IBS-D  with assoc lymphocytic colitis. Neg colon 11-22-16 except for mild left colonic div and int hoids. Random colon Bx- lymphocytic colitis.  Responded well to Bentyl and taking her off NSAIDs. Neg CT abdo/pel 12/01/2016, CTA 12/2021  #3. GERD. Small HH on CT 11/2016.  Plan:  - Pepcid 20mg  po BID - Stool studies for GI pathogens, calprotectin, elastase, hemoccult - increase Cholestramine 4g po BID - Continue dicyclomine 20mg  po TID prn # 90 - She wants to avoid budesonide  - FU in 24 weeks  HPI:    Ashley Brennan is a 77 y.o. female   FU GERD better  Diarrhea -  Occ crampy pain   Budesadde 2/day was good  FU ED visit 01/12/2022 with epi/chest pain, MI r/o, Nl CBC, CMP, lipase. CTA neg except for gastritis.  She was given a trial of Protonix 20 mg p.o. daily.  Unfortunately it resulted in diarrhea.  She has stopped taking the medication.  She continues to have significant heartburn with with some dysphagia-mostly to solids getting hung up in the lower chest.  She denies having any dysphagia.  No recent antibiotics.  Here for further evaluation.  Denies having any significant diarrhea or constipation.  No jaundice dark urine or pale stools.  Wt Readings from Last 3 Encounters:  11/03/22 142 lb (64.4 kg)  04/04/22 145 lb (65.8 kg)  02/21/22 145 lb 12.8 oz (66.1 kg)     Past GI procedures:  EGD 03/2022 - Small hiatal hernia. - Mild gastritis. Biopsied. - S/P esophageal dilatation.  CTA 01/13/2022: Negative except for gastritis.  No focal ulcers.  Diverticulosis without diverticulitis.  Normal vasculature.  Colonoscopy 11/03/2016 ([facility]): Mild left colonic diverticulosis, internal hemorrhoids, otherwise normal to TI. Bx-random colon biopsies positive for lymphocytic  colitis. Remote history of EGD per patient.  SH- Brian Kauzlarich's mom. Daughter in [place]. Mom passed away 11/23/2019 (dementia [place])  Past Medical History:  Diagnosis Date   Anxiety    Arthritis    Stated she get shots in her hands and feet   Asthma    Back pain    Cataract    Diverticulosis    Fecal incontinence    Flatulence    GERD (gastroesophageal reflux disease)    History of colon polyps    HTN (hypertension)    Hyperlipidemia    IBS (irritable bowel syndrome)    SCC (squamous cell carcinoma) 10/24/2016   RIGHT WRIST TX WITH BX   Seasonal allergies    Skin cancer    Squamous cell   Squamous cell carcinoma of skin 05/10/2008   LEFT FORNT SCALP CX3 5FU   Squamous cell carcinoma of skin 12/09/2019   right lower leg-anterior (CX35FU)   Squamous cell carcinoma of skin 12/09/2019   [facility] center (observe/no tmt per [facility])    Past Surgical History:  Procedure Laterality Date   ABDOMINAL HYSTERECTOMY     APPENDECTOMY     BACK SURGERY     CATARACT EXTRACTION, BILATERAL     COLONOSCOPY  11/03/2016   Diverticulosis of colon. Internal hemorrhoids   FOOT SURGERY Right    said they put her joint in her foot. [facility]   KNEE SURGERY      Family History  Problem Relation Age of Onset  Prostate cancer Father    Lung cancer Father    Colon cancer Neg Hx    Esophageal cancer Neg Hx    Rectal cancer Neg Hx    Stomach cancer Neg Hx     Social History   Tobacco Use   Smoking status: Former   Smokeless tobacco: Never  Vaping Use   Vaping status: Never Used  Substance Use Topics   Alcohol use: Yes    Comment: glass of wine with tonic   Drug use: Not Currently    Current Outpatient Medications  Medication Sig Dispense Refill   amLODipine (NORVASC) 5 MG tablet Take 5 mg by mouth daily.     atorvastatin (LIPITOR) 20 MG tablet Take 20 mg by mouth daily.     Calcium Carbonate Antacid (TUMS PO) Take by mouth daily.     cholestyramine (QUESTRAN) 4 g packet Take 1  packet (4 g total) by mouth daily. Take two hours before or after rest of your medications: 30 each 6   COLLAGEN PO Take by mouth daily.     dicyclomine (BENTYL) 10 MG capsule Take 1 capsule (10 mg total) by mouth in the morning, at noon, in the evening, and at bedtime. 360 capsule 3   fluticasone (CUTIVATE) 0.05 % cream Apply topically 2 (two) times daily. X 10 days 30 g 2   hydrocortisone (ANUSOL-HC) 25 MG suppository Place 1 suppository (25 mg total) rectally 2 (two) times daily. For 10 days to rectum and then as needed 20 suppository 0   lactobacillus acidophilus (BACID) TABS tablet Take 2 tablets by mouth 3 (three) times daily.     losartan (COZAAR) 50 MG tablet Take 50 mg by mouth daily.     Polyethyl Glycol-Propyl Glycol (SYSTANE) 0.4-0.3 % GEL ophthalmic gel Apply 1 application  to eye as needed.     Acetaminophen (TYLENOL PO) Take 1 tablet by mouth as needed. (Patient not taking: Reported on 11/03/2022)     RABEprazole (ACIPHEX) 20 MG tablet Take 1 tablet (20 mg total) by mouth daily. (Patient not taking: Reported on 11/03/2022) 30 tablet 6   sucralfate (CARAFATE) 1 g tablet Take 1 tablet (1 g total) by mouth 4 (four) times daily as needed. (Patient not taking: Reported on 04/04/2022) 30 tablet 0   No current facility-administered medications for this visit.    Allergies  Allergen Reactions   Omeprazole Other (See Comments)    Review of Systems:  neg     Physical Exam:    BP 126/80   Pulse 71   Ht 5\' 2"  (1.575 m)   Wt 142 lb (64.4 kg)   BMI 25.97 kg/m  Filed Weights   11/03/22 1128  Weight: 142 lb (64.4 kg)   Constitutional:  Well-developed, in no acute distress. Psychiatric: Normal mood and affect. Behavior is normal. HEENT: No scleral icterus. Cardiovascular: Normal rate, regular rhythm. No edema Pulmonary/chest: Effort normal and breath sounds normal. No wheezing, rales or rhonchi. Abdominal: Soft, nondistended.  Epi tenderness. Bowel sounds active throughout. There  are no masses palpable. No hepatomegaly. Neurological: Alert and oriented to person place and time. Skin: Skin is warm and dry. No rashes noted.  Data Reviewed: I have personally reviewed following labs and imaging studies  CBC:    Latest Ref Rng & Units 01/12/2022    9:22 PM 08/19/2009   12:54 PM  CBC  WBC 4.0 - 10.5 K/uL 6.7  6.1   Hemoglobin 12.0 - 15.0 g/dL 40.9  81.1  Hematocrit 36.0 - 46.0 % 46.8  43.2   Platelets 150 - 400 K/uL 266  325     CMP:    Latest Ref Rng & Units 01/13/2022   12:03 AM 01/12/2022    9:22 PM 08/19/2009   12:54 PM  CMP  Glucose 70 - 99 mg/dL  563  99   BUN 8 - 23 mg/dL  17  11   Creatinine 8.75 - 1.00 mg/dL  6.43  3.29   Sodium 518 - 145 mmol/L  142  140   Potassium 3.5 - 5.1 mmol/L  3.7  3.8   Chloride 98 - 111 mmol/L  102  100   CO2 22 - 32 mmol/L  28  33   Calcium 8.9 - 10.3 mg/dL  84.1  9.5   Total Protein 6.5 - 8.1 g/dL 6.6     Total Bilirubin 0.3 - 1.2 mg/dL 0.4     Alkaline Phos 38 - 126 U/L 55     AST 15 - 41 U/L 14     ALT 0 - 44 U/L 18          Edman Circle, MD 11/03/2022, 11:43 AM  Cc: [name],*

## 2022-11-03 NOTE — Patient Instructions (Addendum)
_______________________________________________________  If your blood pressure at your visit was 140/90 or greater, please contact your primary care physician to follow up on this.  _______________________________________________________  If you are age 77 or older, your body mass index should be between 23-30. Your Body mass index is 25.97 kg/m. If this is out of the aforementioned range listed, please consider follow up with your Primary Care Provider.  If you are age 79 or younger, your body mass index should be between 19-25. Your Body mass index is 25.97 kg/m. If this is out of the aformentioned range listed, please consider follow up with your Primary Care Provider.   ________________________________________________________  The [place] [facility] providers would like to encourage you to use [facility] to communicate with providers for non-urgent requests or questions.  Due to long hold times on the telephone, sending your provider a message by [facility] may be a faster and more efficient way to get a response.  Please allow 48 business hours for a response.  Please remember that this is for non-urgent requests.  _______________________________________________________  We have sent the following medications to your pharmacy for you to pick up at your convenience: Questran 4g 2 times a day  Pepcid 20 2 times a day   Continue Bentyl 20mg  3 times a day as needed  Your provider has requested that you go to the basement level for lab work before leaving today. Press "B" on the elevator. The lab is located at the first door on the left as you exit the elevator.  Your provider has ordered "Diatherix" stool testing for you. You have received a kit from our office today containing all necessary supplies to complete this test. Please carefully read the stool collection instructions provided in the kit before opening the accompanying materials. In addition, be sure to place the label from the top left corner  of the laboratory request sheet onto the "puritan opti-swab" tube that is supplied in the kit. This label should include your full name and date of birth. After completing the test, you should secure the purtian tube into the specimen biohazard bag. The laboratory request information sheet (including date and time of specimen collection) should be placed into the outside pocket of the specimen biohazard bag and returned to the [place] lab with 2 days of collection.   Please follow up in 6 months. Give [place] a call at [phone] to schedule an appointment.  Thank you,  Dr. Lynann Bologna

## 2022-11-13 DIAGNOSIS — Z6826 Body mass index (BMI) 26.0-26.9, adult: Secondary | ICD-10-CM | POA: Diagnosis not present

## 2022-11-13 DIAGNOSIS — M5416 Radiculopathy, lumbar region: Secondary | ICD-10-CM | POA: Diagnosis not present

## 2022-11-15 DIAGNOSIS — Z87891 Personal history of nicotine dependence: Secondary | ICD-10-CM | POA: Diagnosis not present

## 2022-11-15 DIAGNOSIS — K589 Irritable bowel syndrome without diarrhea: Secondary | ICD-10-CM | POA: Diagnosis not present

## 2022-11-15 DIAGNOSIS — I700 Atherosclerosis of aorta: Secondary | ICD-10-CM | POA: Diagnosis not present

## 2022-11-15 DIAGNOSIS — Z809 Family history of malignant neoplasm, unspecified: Secondary | ICD-10-CM | POA: Diagnosis not present

## 2022-11-15 DIAGNOSIS — E785 Hyperlipidemia, unspecified: Secondary | ICD-10-CM | POA: Diagnosis not present

## 2022-11-15 DIAGNOSIS — K219 Gastro-esophageal reflux disease without esophagitis: Secondary | ICD-10-CM | POA: Diagnosis not present

## 2022-11-15 DIAGNOSIS — Z8249 Family history of ischemic heart disease and other diseases of the circulatory system: Secondary | ICD-10-CM | POA: Diagnosis not present

## 2022-11-15 DIAGNOSIS — I10 Essential (primary) hypertension: Secondary | ICD-10-CM | POA: Diagnosis not present

## 2022-11-15 DIAGNOSIS — M1990 Unspecified osteoarthritis, unspecified site: Secondary | ICD-10-CM | POA: Diagnosis not present

## 2022-11-15 DIAGNOSIS — Z823 Family history of stroke: Secondary | ICD-10-CM | POA: Diagnosis not present

## 2022-11-15 DIAGNOSIS — R32 Unspecified urinary incontinence: Secondary | ICD-10-CM | POA: Diagnosis not present

## 2022-11-16 ENCOUNTER — Other Ambulatory Visit: Payer: Medicare HMO

## 2022-11-16 DIAGNOSIS — K449 Diaphragmatic hernia without obstruction or gangrene: Secondary | ICD-10-CM

## 2022-11-16 DIAGNOSIS — R0789 Other chest pain: Secondary | ICD-10-CM

## 2022-11-16 DIAGNOSIS — R1013 Epigastric pain: Secondary | ICD-10-CM | POA: Diagnosis not present

## 2022-11-16 DIAGNOSIS — K580 Irritable bowel syndrome with diarrhea: Secondary | ICD-10-CM

## 2022-11-16 DIAGNOSIS — K219 Gastro-esophageal reflux disease without esophagitis: Secondary | ICD-10-CM | POA: Diagnosis not present

## 2022-11-19 LAB — CALPROTECTIN, FECAL: Calprotectin, Fecal: 53 ug/g (ref 0–120)

## 2022-11-21 ENCOUNTER — Ambulatory Visit (INDEPENDENT_AMBULATORY_CARE_PROVIDER_SITE_OTHER): Payer: Medicare HMO

## 2022-11-21 DIAGNOSIS — R0789 Other chest pain: Secondary | ICD-10-CM | POA: Diagnosis not present

## 2022-11-21 DIAGNOSIS — K580 Irritable bowel syndrome with diarrhea: Secondary | ICD-10-CM

## 2022-11-21 DIAGNOSIS — K219 Gastro-esophageal reflux disease without esophagitis: Secondary | ICD-10-CM

## 2022-11-21 DIAGNOSIS — R1013 Epigastric pain: Secondary | ICD-10-CM

## 2022-11-21 DIAGNOSIS — K449 Diaphragmatic hernia without obstruction or gangrene: Secondary | ICD-10-CM | POA: Diagnosis not present

## 2022-11-21 LAB — HEMOCCULT SLIDES (X 3 CARDS)
Fecal Occult Blood: NEGATIVE
OCCULT 1: NEGATIVE
OCCULT 2: NEGATIVE
OCCULT 3: NEGATIVE
OCCULT 4: NEGATIVE
OCCULT 5: NEGATIVE

## 2022-11-24 DIAGNOSIS — M5416 Radiculopathy, lumbar region: Secondary | ICD-10-CM | POA: Diagnosis not present

## 2022-11-25 LAB — PANCREATIC ELASTASE, FECAL: Pancreatic Elastase-1, Stool: >500 ug/g

## 2022-11-28 ENCOUNTER — Telehealth: Payer: Self-pay

## 2022-11-28 NOTE — Telephone Encounter (Signed)
Patient made aware it came back negative and voiced understanding

## 2022-12-14 ENCOUNTER — Encounter: Payer: Self-pay | Admitting: Gastroenterology

## 2023-01-09 DIAGNOSIS — M9905 Segmental and somatic dysfunction of pelvic region: Secondary | ICD-10-CM | POA: Diagnosis not present

## 2023-01-09 DIAGNOSIS — M9903 Segmental and somatic dysfunction of lumbar region: Secondary | ICD-10-CM | POA: Diagnosis not present

## 2023-01-09 DIAGNOSIS — M9904 Segmental and somatic dysfunction of sacral region: Secondary | ICD-10-CM | POA: Diagnosis not present

## 2023-01-09 DIAGNOSIS — M5136 Other intervertebral disc degeneration, lumbar region with discogenic back pain only: Secondary | ICD-10-CM | POA: Diagnosis not present

## 2023-01-10 DIAGNOSIS — M9903 Segmental and somatic dysfunction of lumbar region: Secondary | ICD-10-CM | POA: Diagnosis not present

## 2023-01-10 DIAGNOSIS — M5136 Other intervertebral disc degeneration, lumbar region with discogenic back pain only: Secondary | ICD-10-CM | POA: Diagnosis not present

## 2023-01-10 DIAGNOSIS — M9905 Segmental and somatic dysfunction of pelvic region: Secondary | ICD-10-CM | POA: Diagnosis not present

## 2023-01-10 DIAGNOSIS — M9904 Segmental and somatic dysfunction of sacral region: Secondary | ICD-10-CM | POA: Diagnosis not present

## 2023-01-23 ENCOUNTER — Other Ambulatory Visit: Payer: Self-pay | Admitting: Family Medicine

## 2023-01-23 DIAGNOSIS — M25562 Pain in left knee: Secondary | ICD-10-CM

## 2023-01-24 ENCOUNTER — Other Ambulatory Visit: Payer: Self-pay | Admitting: Gastroenterology

## 2023-01-24 ENCOUNTER — Encounter: Payer: Self-pay | Admitting: Gastroenterology

## 2023-01-24 DIAGNOSIS — K648 Other hemorrhoids: Secondary | ICD-10-CM

## 2023-01-24 NOTE — Telephone Encounter (Signed)
PT is calling  to get an update on her refill request for suppositories. [facility] needs to [facility] to [facility] [address].

## 2023-01-24 NOTE — Telephone Encounter (Signed)
Done

## 2023-02-01 DIAGNOSIS — R69 Illness, unspecified: Secondary | ICD-10-CM | POA: Diagnosis not present

## 2023-02-06 ENCOUNTER — Ambulatory Visit
Admission: RE | Admit: 2023-02-06 | Discharge: 2023-02-06 | Disposition: A | Discharge status: Home / Self Care | Payer: Medicare HMO | Source: Ambulatory Visit | Attending: Family Medicine | Admitting: Family Medicine

## 2023-02-06 DIAGNOSIS — M25562 Pain in left knee: Secondary | ICD-10-CM

## 2023-02-06 DIAGNOSIS — R2242 Localized swelling, mass and lump, left lower limb: Secondary | ICD-10-CM | POA: Diagnosis not present

## 2023-02-28 DIAGNOSIS — M5416 Radiculopathy, lumbar region: Secondary | ICD-10-CM | POA: Diagnosis not present

## 2023-04-10 DIAGNOSIS — M25552 Pain in left hip: Secondary | ICD-10-CM | POA: Diagnosis not present

## 2023-04-12 DIAGNOSIS — M5416 Radiculopathy, lumbar region: Secondary | ICD-10-CM | POA: Diagnosis not present

## 2023-04-16 ENCOUNTER — Ambulatory Visit: Payer: Medicare HMO | Admitting: Podiatry

## 2023-04-20 DIAGNOSIS — M1812 Unilateral primary osteoarthritis of first carpometacarpal joint, left hand: Secondary | ICD-10-CM | POA: Diagnosis not present

## 2023-04-27 ENCOUNTER — Telehealth: Payer: Self-pay | Admitting: Gastroenterology

## 2023-04-27 NOTE — Telephone Encounter (Signed)
 Pt needs a refill for her famotidine   [facility]

## 2023-04-27 NOTE — Telephone Encounter (Signed)
 I believe this was meant for [employer], CMA.

## 2023-04-27 NOTE — Telephone Encounter (Signed)
 Voicemail not set up on cell and lvm on home.  Script sent on 11-03-22 was sent for a 90 day (180 tablet) supply with 3 refills. Thats a year supply and it hasn't been a year yet. Is she sure she needs a refill  Called pharmacy and she did have refills she just didn't call them. [facility] said they will refill it for her

## 2023-05-03 DIAGNOSIS — Z4789 Encounter for other orthopedic aftercare: Secondary | ICD-10-CM | POA: Diagnosis not present

## 2023-05-11 DIAGNOSIS — H43812 Vitreous degeneration, left eye: Secondary | ICD-10-CM | POA: Diagnosis not present

## 2023-05-17 DIAGNOSIS — M1811 Unilateral primary osteoarthritis of first carpometacarpal joint, right hand: Secondary | ICD-10-CM | POA: Diagnosis not present

## 2023-05-17 DIAGNOSIS — M79645 Pain in left finger(s): Secondary | ICD-10-CM | POA: Diagnosis not present

## 2023-05-17 DIAGNOSIS — Z4789 Encounter for other orthopedic aftercare: Secondary | ICD-10-CM | POA: Diagnosis not present

## 2023-05-17 DIAGNOSIS — M1812 Unilateral primary osteoarthritis of first carpometacarpal joint, left hand: Secondary | ICD-10-CM | POA: Diagnosis not present

## 2023-05-22 DIAGNOSIS — M25552 Pain in left hip: Secondary | ICD-10-CM | POA: Diagnosis not present

## 2023-05-24 DIAGNOSIS — M79645 Pain in left finger(s): Secondary | ICD-10-CM | POA: Diagnosis not present

## 2023-05-28 DIAGNOSIS — M5136 Other intervertebral disc degeneration, lumbar region with discogenic back pain only: Secondary | ICD-10-CM | POA: Diagnosis not present

## 2023-05-28 DIAGNOSIS — M9902 Segmental and somatic dysfunction of thoracic region: Secondary | ICD-10-CM | POA: Diagnosis not present

## 2023-05-29 DIAGNOSIS — M9902 Segmental and somatic dysfunction of thoracic region: Secondary | ICD-10-CM | POA: Diagnosis not present

## 2023-05-29 DIAGNOSIS — M5136 Other intervertebral disc degeneration, lumbar region with discogenic back pain only: Secondary | ICD-10-CM | POA: Diagnosis not present

## 2023-05-31 ENCOUNTER — Ambulatory Visit: Admitting: Podiatry

## 2023-05-31 ENCOUNTER — Encounter: Payer: Self-pay | Admitting: Podiatry

## 2023-05-31 DIAGNOSIS — M79645 Pain in left finger(s): Secondary | ICD-10-CM | POA: Diagnosis not present

## 2023-05-31 DIAGNOSIS — M25559 Pain in unspecified hip: Secondary | ICD-10-CM | POA: Insufficient documentation

## 2023-05-31 DIAGNOSIS — G5700 Lesion of sciatic nerve, unspecified lower limb: Secondary | ICD-10-CM | POA: Insufficient documentation

## 2023-05-31 DIAGNOSIS — M545 Low back pain, unspecified: Secondary | ICD-10-CM | POA: Insufficient documentation

## 2023-05-31 DIAGNOSIS — L603 Nail dystrophy: Secondary | ICD-10-CM

## 2023-05-31 DIAGNOSIS — L600 Ingrowing nail: Secondary | ICD-10-CM

## 2023-05-31 DIAGNOSIS — M48061 Spinal stenosis, lumbar region without neurogenic claudication: Secondary | ICD-10-CM | POA: Insufficient documentation

## 2023-05-31 DIAGNOSIS — M461 Sacroiliitis, not elsewhere classified: Secondary | ICD-10-CM | POA: Insufficient documentation

## 2023-05-31 NOTE — Progress Notes (Signed)
 Ashley Brennan presents today chief complaint of concern to her hallux nails bilaterally concerned that they have become discolored and that she has a mild ingrown toenail to the fibular border of the hallux right.  She is also recovering from left hand surgery stating that her right hand seems to be getting worse.  Objective: Vital signs are stable she is alert oriented x 3 have reviewed her past medical history medications allergies surgery social history.  Pulses are strong and palpable to the bilateral lower extremity neurologic sensorium is intact deep tendon reflexes are intact muscle strength is normal and symmetrical.  Nail plates appear to be normal and healthy she does have some binder from nail polish on the tips of the toes which she is confused with fungus.  She does have a mildly incurvated nail margin fibular border right.  There is no erythema cellulitis drainage or odor.  Assessment: Normal nail plates bilateral mild ingrown nail fibular border hallux right.  Plan: I slanted back the corners of her nails for her today hopefully this alleviate her symptoms she states that she wants to get over her hands before she has any more surgery on her toes.

## 2023-06-04 ENCOUNTER — Encounter: Payer: Self-pay | Admitting: Gastroenterology

## 2023-06-04 NOTE — Telephone Encounter (Signed)
 Pt was contacted in regard to my chart message that was sent by pt. Pt stated that for the last two weeks that she has had an increase in abdominal cramps. Pt stated that she did have a hand surgery recently in January. Pt stated that she has not been on antibiotics recently. Pt does take cholestyramine packets to manage her diarrhea and does take Bentyl. Pt stated that the diarrhea is under control although the Bentyl  is not helping with the increase abdominal cramping. Pt to be scheduled for a an office visit with Dr. Chales Abrahams on 06/19/2023 at 2:30 PM. ( 7 day hold) pt made aware.  Pt verbalized understanding with all questions answered.

## 2023-06-07 DIAGNOSIS — M79645 Pain in left finger(s): Secondary | ICD-10-CM | POA: Diagnosis not present

## 2023-06-12 DIAGNOSIS — M461 Sacroiliitis, not elsewhere classified: Secondary | ICD-10-CM | POA: Diagnosis not present

## 2023-06-14 DIAGNOSIS — M79645 Pain in left finger(s): Secondary | ICD-10-CM | POA: Diagnosis not present

## 2023-06-14 DIAGNOSIS — Z4789 Encounter for other orthopedic aftercare: Secondary | ICD-10-CM | POA: Diagnosis not present

## 2023-06-15 ENCOUNTER — Telehealth: Payer: Self-pay

## 2023-06-15 NOTE — Telephone Encounter (Signed)
 7 day hold appointment.  Pt was scheduled for an office visit on 06/19/2023 at 2:30 Pm  with Dr. Chales Abrahams. Pt made aware.  Pt verbalized understanding with all questions answered.

## 2023-06-19 ENCOUNTER — Encounter: Payer: Self-pay | Admitting: Gastroenterology

## 2023-06-19 ENCOUNTER — Ambulatory Visit: Admitting: Gastroenterology

## 2023-06-19 ENCOUNTER — Other Ambulatory Visit (INDEPENDENT_AMBULATORY_CARE_PROVIDER_SITE_OTHER)

## 2023-06-19 VITALS — BP 120/80 | HR 74 | Ht 61.0 in | Wt 136.0 lb

## 2023-06-19 DIAGNOSIS — K580 Irritable bowel syndrome with diarrhea: Secondary | ICD-10-CM

## 2023-06-19 DIAGNOSIS — K449 Diaphragmatic hernia without obstruction or gangrene: Secondary | ICD-10-CM

## 2023-06-19 DIAGNOSIS — R300 Dysuria: Secondary | ICD-10-CM

## 2023-06-19 DIAGNOSIS — K219 Gastro-esophageal reflux disease without esophagitis: Secondary | ICD-10-CM

## 2023-06-19 DIAGNOSIS — R1084 Generalized abdominal pain: Secondary | ICD-10-CM

## 2023-06-19 LAB — URINALYSIS WITH CULTURE, IF INDICATED
Bilirubin Urine: NEGATIVE
Hgb urine dipstick: NEGATIVE
Nitrite: NEGATIVE
RBC / HPF: NONE SEEN (ref 0–?)
Specific Gravity, Urine: >=1.03 — AB (ref 1.000–1.030)
Total Protein, Urine: NEGATIVE
Urine Glucose: NEGATIVE
Urobilinogen, UA: 0.2 (ref 0.0–1.0)
pH: 6 (ref 5.0–8.0)

## 2023-06-19 LAB — CBC WITH DIFFERENTIAL/PLATELET
Basophils Absolute: 0 10*3/uL (ref 0.0–0.1)
Basophils Relative: 0.2 % (ref 0.0–3.0)
Eosinophils Absolute: 0 10*3/uL (ref 0.0–0.7)
Eosinophils Relative: 0.2 % (ref 0.0–5.0)
HCT: 44.2 % (ref 36.0–46.0)
Hemoglobin: 14.3 g/dL (ref 12.0–15.0)
Lymphocytes Relative: 29 % (ref 12.0–46.0)
Lymphs Abs: 2.7 10*3/uL (ref 0.7–4.0)
MCHC: 32.4 g/dL (ref 30.0–36.0)
MCV: 88.1 fl (ref 78.0–100.0)
Monocytes Absolute: 0.8 10*3/uL (ref 0.1–1.0)
Monocytes Relative: 8.2 % (ref 3.0–12.0)
Neutro Abs: 5.8 10*3/uL (ref 1.4–7.7)
Neutrophils Relative %: 62.4 % (ref 43.0–77.0)
Platelets: 343 10*3/uL (ref 150.0–400.0)
RBC: 5.02 Mil/uL (ref 3.87–5.11)
RDW: 13.3 % (ref 11.5–15.5)
WBC: 9.3 10*3/uL (ref 4.0–10.5)

## 2023-06-19 LAB — COMPREHENSIVE METABOLIC PANEL WITH GFR
ALT: 12 U/L (ref 0–35)
AST: 10 U/L (ref 0–37)
Albumin: 4.3 g/dL (ref 3.5–5.2)
Alkaline Phosphatase: 72 U/L (ref 39–117)
BUN: 24 mg/dL — ABNORMAL HIGH (ref 6–23)
CO2: 30 meq/L (ref 19–32)
Calcium: 9.4 mg/dL (ref 8.4–10.5)
Chloride: 102 meq/L (ref 96–112)
Creatinine, Ser: 0.91 mg/dL (ref 0.40–1.20)
GFR: 60.91 mL/min (ref >60.00)
Glucose, Bld: 105 mg/dL — ABNORMAL HIGH (ref 70–99)
Potassium: 4.1 meq/L (ref 3.5–5.1)
Sodium: 141 meq/L (ref 135–145)
Total Bilirubin: 0.5 mg/dL (ref 0.2–1.2)
Total Protein: 7 g/dL (ref 6.0–8.3)

## 2023-06-19 LAB — C-REACTIVE PROTEIN: CRP: <1 mg/dL (ref 0.5–20.0)

## 2023-06-19 LAB — LIPASE: Lipase: 40 U/L (ref 11.0–59.0)

## 2023-06-19 MED ORDER — DICYCLOMINE HCL 20 MG PO TABS: 20.0000 mg | ORAL_TABLET | Freq: Three times a day (TID) | ORAL | 3 refills | Status: DC | PRN

## 2023-06-19 MED ORDER — CHOLESTYRAMINE 4 G PO PACK: 4.0000 g | PACK | Freq: Two times a day (BID) | ORAL | 1 refills | Status: AC

## 2023-06-19 MED ORDER — FAMOTIDINE 20 MG PO TABS: 20.0000 mg | ORAL_TABLET | Freq: Two times a day (BID) | ORAL | 1 refills | Status: DC

## 2023-06-19 MED ORDER — DICYCLOMINE HCL 20 MG PO TABS: 20.0000 mg | ORAL_TABLET | Freq: Three times a day (TID) | ORAL | 2 refills | Status: AC | PRN

## 2023-06-19 NOTE — Patient Instructions (Signed)
 _______________________________________________________  If your blood pressure at your visit was 140/90 or greater, please contact your primary care physician to follow up on this.  _______________________________________________________  If you are age 78 or older, your body mass index should be between 23-30. Your Body mass index is 25.7 kg/m. If this is out of the aforementioned range listed, please consider follow up with your Primary Care Provider.  If you are age 49 or younger, your body mass index should be between 19-25. Your Body mass index is 25.7 kg/m. If this is out of the aformentioned range listed, please consider follow up with your Primary Care Provider.   ________________________________________________________  The [place] [facility] providers would like to encourage you to use [facility] to communicate with providers for non-urgent requests or questions.  Due to long hold times on the telephone, sending your provider a message by [facility] may be a faster and more efficient way to get a response.  Please allow 48 business hours for a response.  Please remember that this is for non-urgent requests.  _______________________________________________________  Your provider has requested that you go to the basement level for lab work before leaving today. Press "B" on the elevator. The lab is located at the first door on the left as you exit the elevator.  You have been scheduled for an abdominal ultrasound at [facility] (1st floor of hospital) on 06-25-23 at 930am. Please arrive 30 minutes prior to your appointment for registration. Make certain not to have anything to eat or drink midnight prior to your appointment. Should you need to reschedule your appointment, please contact radiology at [phone]. This test typically takes about 30 minutes to perform.  We have sent the following medications to your pharmacy for you to pick up at your convenience: Pepcid 20mg  2 times a  day Questran 1 packet 2 times a day. Take 2 hours before or after all other medications   Continue bentyl 20mg  3 times a day as needed  Please follow up in 6 months. Give [place] a call at [phone] to schedule an appointment.  Thank you,  Dr. Lynann Bologna

## 2023-06-19 NOTE — Progress Notes (Signed)
 Chief Complaint: FU  Referring Provider:  [name],*      ASSESSMENT AND PLAN;   #1. Gen abdo pain (better). Had diarrhea with protonix 20QD.  #2. IBS-D with assoc lymphocytic colitis. Neg colon 10/2016 except for mild left colonic div and int hoids. Random colon Bx- lymphocytic colitis.  Responded well to Bentyl and taking her off NSAIDs. Neg CT abdo/pel 12/01/2016, CTA 12/2021. Neg stool for GI pathogen, calprotectin, elastase, hemoccult.  #3. GERD. Small HH on CT 11/2016.  Plan:  - CBC, CMP, CRP, lipase - UA (for dysuria) - [place] abdo - Pepcid 20mg  po BID - Cholestryamine 4g po BID #60, 3RF - Continue dicyclomine 20mg  po TID prn # 90 - FU in 24 weeks.  HPI:    Ashley Brennan is a 78 y.o. female     History of Present Illness Ashley Brennan is a 78 year old female with lymphocytic colitis who presents with gastrointestinal symptoms and post-surgical recovery issues. She is Ashley Brennan's mom, who appears to be a family member and provides medication support.  She has been experiencing severe gastrointestinal symptoms, including diarrhea and generalized abdominal pain, initially concerned it might be related to her gallbladder. The symptoms were severe enough to cause a weight loss of two pounds over a weekend. The diarrhea left her feeling completely 'cleaned out' and bedridden. She took pantoprazole, which was helpful in alleviating her stomach issues, along with dicyclomine, Maalox, and lidocaine for symptom relief. She is cautious with her medication use due to her history of lymphocytic colitis, diagnosed in July 17, 2016.  Did have diarrhea with with use of pantoprazole continuously for a week in past.  She underwent significant hand surgery on her left hand to remove a joint due to severe arthritis. The recovery period from January to March was particularly challenging, with significant pain and limited mobility. The pain from the surgery exacerbated pain in other areas affected by  arthritis. She has been using oxycodone for pain management and methylcarbamol, which she suspects might have contributed to her gastrointestinal symptoms. Ongoing issues with fatigue and weakness are attributed to the surgery and anesthesia. She describes feeling unusually 'puny' and tired, with a significant impact on her ability to perform daily activities. She has been largely inactive, spending much of her time in a recliner or bed, and has only recently begun to resume some physical activity and driving.  She mentions a history of urinary symptoms, including burning during urination, which she attributes to age. No current urinary tract infection symptoms, but she notes experiencing burning even when her urine is clear. She is aware of a family history of urinary tract infections in older relatives.     Wt Readings from Last 3 Encounters:  06/19/23 136 lb (61.7 kg)  11/03/22 142 lb (64.4 kg)  04/04/22 145 lb (65.8 kg)     Past GI procedures:  EGD 03/2022 - Small hiatal hernia. - Mild gastritis. Biopsied. - S/P esophageal dilatation.  CTA 01/13/2022: Negative except for gastritis.  No focal ulcers.  Diverticulosis without diverticulitis.  Normal vasculature.  Colonoscopy 11/03/2016 ([facility]): Mild left colonic diverticulosis, internal hemorrhoids, otherwise normal to TI. Bx-random colon biopsies positive for lymphocytic colitis. Remote history of EGD per patient.  SH- Ashley Brennan Tubby's mom. Daughter in [place]. Mom passed away 2019-07-18 (dementia [place])  Past Medical History:  Diagnosis Date   Anxiety    Arthritis    Stated she get shots in her hands and feet   Asthma    Back  pain    Cataract    Diverticulosis    Fecal incontinence    Flatulence    GERD (gastroesophageal reflux disease)    History of colon polyps    HTN (hypertension)    Hyperlipidemia    IBS (irritable bowel syndrome)    SCC (squamous cell carcinoma) 10/24/2016   RIGHT WRIST TX WITH BX   Seasonal allergies     Skin cancer    Squamous cell   Squamous cell carcinoma of skin 05/10/2008   LEFT FORNT SCALP CX3 5FU   Squamous cell carcinoma of skin 12/09/2019   right lower leg-anterior (CX35FU)   Squamous cell carcinoma of skin 12/09/2019   [facility] center (observe/no tmt per [facility])    Past Surgical History:  Procedure Laterality Date   ABDOMINAL HYSTERECTOMY     APPENDECTOMY     BACK SURGERY     CATARACT EXTRACTION, BILATERAL     COLONOSCOPY  11/03/2016   Diverticulosis of colon. Internal hemorrhoids   FOOT SURGERY Right    said they put her joint in her foot. [facility]   KNEE SURGERY      Family History  Problem Relation Age of Onset   Prostate cancer Father    Lung cancer Father    Colon cancer Neg Hx    Esophageal cancer Neg Hx    Rectal cancer Neg Hx    Stomach cancer Neg Hx     Social History   Tobacco Use   Smoking status: Former   Smokeless tobacco: Never  Vaping Use   Vaping status: Never Used  Substance Use Topics   Alcohol use: Yes    Comment: glass of wine with tonic   Drug use: Not Currently    Current Outpatient Medications  Medication Sig Dispense Refill   Acetaminophen (TYLENOL PO) Take 1 tablet by mouth as needed.     amLODipine (NORVASC) 5 MG tablet Take 5 mg by mouth daily.     atorvastatin (LIPITOR) 20 MG tablet Take 20 mg by mouth daily.     Calcium Carbonate Antacid (TUMS PO) Take by mouth daily.     cholestyramine (QUESTRAN) 4 g packet Take 1 packet (4 g total) by mouth daily. Take two hours before or after rest of your medications: 30 each 6   cholestyramine (QUESTRAN) 4 g packet Take 1 packet (4 g total) by mouth 2 (two) times daily. Take 2 hours before or after all other medications 60 each 12   COLLAGEN PO Take by mouth daily.     dicyclomine (BENTYL) 10 MG capsule Take 1 capsule (10 mg total) by mouth in the morning, at noon, in the evening, and at bedtime. 360 capsule 3   famotidine (PEPCID) 20 MG tablet Take 1 tablet (20 mg total) by mouth 2  (two) times daily. 180 tablet 3   fluticasone (CUTIVATE) 0.05 % cream Apply topically 2 (two) times daily. X 10 days 30 g 2   hydrocortisone (ANUSOL-HC) 25 MG suppository PLACE 1 SUPPOSITORY (25 MG) RECTALLY 2 (TWO) TIMES DAILY. FOR 10 DAYS TO RECTUM AND THEN AS NEEDED 20 suppository 0   lactobacillus acidophilus (BACID) TABS tablet Take 2 tablets by mouth 3 (three) times daily.     losartan (COZAAR) 50 MG tablet Take 50 mg by mouth daily.     Polyethyl Glycol-Propyl Glycol (SYSTANE) 0.4-0.3 % GEL ophthalmic gel Apply 1 application  to eye as needed.     RABEprazole (ACIPHEX) 20 MG tablet Take 1 tablet (20 mg  total) by mouth daily. (Patient not taking: Reported on 06/19/2023) 30 tablet 6   sucralfate (CARAFATE) 1 g tablet Take 1 tablet (1 g total) by mouth 4 (four) times daily as needed. (Patient not taking: Reported on 06/19/2023) 30 tablet 0   No current facility-administered medications for this visit.    Allergies  Allergen Reactions   Budesonide     Other Reaction(s): thrush   Omeprazole Other (See Comments)   Verapamil     Other Reaction(s): cough    Review of Systems:  neg     Physical Exam:    BP 120/80   Pulse 74   Ht 5\' 1"  (1.549 m)   Wt 136 lb (61.7 kg)   BMI 25.70 kg/m  Filed Weights   06/19/23 1422  Weight: 136 lb (61.7 kg)   Constitutional:  Well-developed, in no acute distress. Psychiatric: Normal mood and affect. Behavior is normal. HEENT: No scleral icterus. Cardiovascular: Normal rate, regular rhythm. No edema Pulmonary/chest: Effort normal and breath sounds normal. No wheezing, rales or rhonchi. Abdominal: Soft, nondistended.  Epi tenderness. Bowel sounds active throughout. There are no masses palpable. No hepatomegaly. Neurological: Alert and oriented to person place and time. Skin: Skin is warm and dry. No rashes noted.  Data Reviewed: I have personally reviewed following labs and imaging studies  CBC:    Latest Ref Rng & Units 01/12/2022    9:22  PM 08/19/2009   12:54 PM  CBC  WBC 4.0 - 10.5 K/uL 6.7  6.1   Hemoglobin 12.0 - 15.0 g/dL 40.9  81.1   Hematocrit 36.0 - 46.0 % 46.8  43.2   Platelets 150 - 400 K/uL 266  325     CMP:    Latest Ref Rng & Units 01/13/2022   12:03 AM 01/12/2022    9:22 PM 08/19/2009   12:54 PM  CMP  Glucose 70 - 99 mg/dL  914  99   BUN 8 - 23 mg/dL  17  11   Creatinine 7.82 - 1.00 mg/dL  9.56  2.13   Sodium 086 - 145 mmol/L  142  140   Potassium 3.5 - 5.1 mmol/L  3.7  3.8   Chloride 98 - 111 mmol/L  102  100   CO2 22 - 32 mmol/L  28  33   Calcium 8.9 - 10.3 mg/dL  57.8  9.5   Total Protein 6.5 - 8.1 g/dL 6.6     Total Bilirubin 0.3 - 1.2 mg/dL 0.4     Alkaline Phos 38 - 126 U/L 55     AST 15 - 41 U/L 14     ALT 0 - 44 U/L 18          Edman Circle, MD 06/19/2023, 2:52 PM  Cc: [name],*

## 2023-06-20 ENCOUNTER — Telehealth: Payer: Self-pay

## 2023-06-20 ENCOUNTER — Other Ambulatory Visit (HOSPITAL_COMMUNITY): Payer: Self-pay

## 2023-06-20 NOTE — Telephone Encounter (Signed)
 Pharmacy Patient Advocate Encounter   Received notification from CoverMyMeds that prior authorization for Dicyclomine HCl 20MG  tablets is required/requested.   Insurance verification completed.   The patient is insured through [facility] .   Prior Authorization for Dicyclomine HCl 20MG  tablets has been APPROVED from 03-21-2023 to 03-19-2024   PA #/Case ID/Reference #: [id number]

## 2023-06-20 NOTE — Telephone Encounter (Signed)
 Noted.

## 2023-06-21 DIAGNOSIS — M79645 Pain in left finger(s): Secondary | ICD-10-CM | POA: Diagnosis not present

## 2023-06-25 ENCOUNTER — Ambulatory Visit (HOSPITAL_COMMUNITY)
Admission: RE | Admit: 2023-06-25 | Discharge: 2023-06-25 | Disposition: A | Discharge status: Home / Self Care | Source: Ambulatory Visit | Attending: Gastroenterology | Admitting: Gastroenterology

## 2023-06-25 DIAGNOSIS — K449 Diaphragmatic hernia without obstruction or gangrene: Secondary | ICD-10-CM | POA: Insufficient documentation

## 2023-06-25 DIAGNOSIS — K580 Irritable bowel syndrome with diarrhea: Secondary | ICD-10-CM

## 2023-06-25 DIAGNOSIS — K219 Gastro-esophageal reflux disease without esophagitis: Secondary | ICD-10-CM | POA: Diagnosis not present

## 2023-06-25 DIAGNOSIS — R1084 Generalized abdominal pain: Secondary | ICD-10-CM

## 2023-06-25 DIAGNOSIS — K760 Fatty (change of) liver, not elsewhere classified: Secondary | ICD-10-CM | POA: Diagnosis not present

## 2023-06-28 DIAGNOSIS — M79645 Pain in left finger(s): Secondary | ICD-10-CM | POA: Diagnosis not present

## 2023-06-28 DIAGNOSIS — M9903 Segmental and somatic dysfunction of lumbar region: Secondary | ICD-10-CM | POA: Diagnosis not present

## 2023-06-28 DIAGNOSIS — M9904 Segmental and somatic dysfunction of sacral region: Secondary | ICD-10-CM | POA: Diagnosis not present

## 2023-06-28 DIAGNOSIS — M9905 Segmental and somatic dysfunction of pelvic region: Secondary | ICD-10-CM | POA: Diagnosis not present

## 2023-06-28 DIAGNOSIS — M51361 Other intervertebral disc degeneration, lumbar region with lower extremity pain only: Secondary | ICD-10-CM | POA: Diagnosis not present

## 2023-07-04 DIAGNOSIS — M25552 Pain in left hip: Secondary | ICD-10-CM | POA: Diagnosis not present

## 2023-07-04 DIAGNOSIS — M25562 Pain in left knee: Secondary | ICD-10-CM | POA: Diagnosis not present

## 2023-07-05 DIAGNOSIS — M9904 Segmental and somatic dysfunction of sacral region: Secondary | ICD-10-CM | POA: Diagnosis not present

## 2023-07-05 DIAGNOSIS — M79645 Pain in left finger(s): Secondary | ICD-10-CM | POA: Diagnosis not present

## 2023-07-05 DIAGNOSIS — M9905 Segmental and somatic dysfunction of pelvic region: Secondary | ICD-10-CM | POA: Diagnosis not present

## 2023-07-05 DIAGNOSIS — M9903 Segmental and somatic dysfunction of lumbar region: Secondary | ICD-10-CM | POA: Diagnosis not present

## 2023-07-05 DIAGNOSIS — M51361 Other intervertebral disc degeneration, lumbar region with lower extremity pain only: Secondary | ICD-10-CM | POA: Diagnosis not present

## 2023-07-09 DIAGNOSIS — Z961 Presence of intraocular lens: Secondary | ICD-10-CM | POA: Diagnosis not present

## 2023-07-09 DIAGNOSIS — H52203 Unspecified astigmatism, bilateral: Secondary | ICD-10-CM | POA: Diagnosis not present

## 2023-07-12 DIAGNOSIS — M79645 Pain in left finger(s): Secondary | ICD-10-CM | POA: Diagnosis not present

## 2023-07-13 DIAGNOSIS — M1712 Unilateral primary osteoarthritis, left knee: Secondary | ICD-10-CM | POA: Diagnosis not present

## 2023-07-16 DIAGNOSIS — M5416 Radiculopathy, lumbar region: Secondary | ICD-10-CM | POA: Diagnosis not present

## 2023-07-30 DIAGNOSIS — M79642 Pain in left hand: Secondary | ICD-10-CM | POA: Diagnosis not present

## 2023-07-30 DIAGNOSIS — M79641 Pain in right hand: Secondary | ICD-10-CM | POA: Diagnosis not present

## 2023-07-30 DIAGNOSIS — M1812 Unilateral primary osteoarthritis of first carpometacarpal joint, left hand: Secondary | ICD-10-CM | POA: Diagnosis not present

## 2023-07-30 DIAGNOSIS — Z4789 Encounter for other orthopedic aftercare: Secondary | ICD-10-CM | POA: Diagnosis not present

## 2023-07-31 DIAGNOSIS — L564 Polymorphous light eruption: Secondary | ICD-10-CM | POA: Diagnosis not present

## 2023-07-31 DIAGNOSIS — L570 Actinic keratosis: Secondary | ICD-10-CM | POA: Diagnosis not present

## 2023-07-31 DIAGNOSIS — R21 Rash and other nonspecific skin eruption: Secondary | ICD-10-CM | POA: Diagnosis not present

## 2023-08-07 DIAGNOSIS — R7301 Impaired fasting glucose: Secondary | ICD-10-CM | POA: Diagnosis not present

## 2023-08-07 DIAGNOSIS — I8393 Asymptomatic varicose veins of bilateral lower extremities: Secondary | ICD-10-CM | POA: Diagnosis not present

## 2023-08-07 DIAGNOSIS — K449 Diaphragmatic hernia without obstruction or gangrene: Secondary | ICD-10-CM | POA: Diagnosis not present

## 2023-08-07 DIAGNOSIS — M47816 Spondylosis without myelopathy or radiculopathy, lumbar region: Secondary | ICD-10-CM | POA: Diagnosis not present

## 2023-08-07 DIAGNOSIS — Z79899 Other long term (current) drug therapy: Secondary | ICD-10-CM | POA: Diagnosis not present

## 2023-08-09 DIAGNOSIS — M25562 Pain in left knee: Secondary | ICD-10-CM | POA: Diagnosis not present

## 2023-08-19 DIAGNOSIS — M25562 Pain in left knee: Secondary | ICD-10-CM | POA: Diagnosis not present

## 2023-08-23 ENCOUNTER — Telehealth: Payer: Self-pay | Admitting: Pharmacist

## 2023-08-23 DIAGNOSIS — M23204 Derangement of unspecified medial meniscus due to old tear or injury, left knee: Secondary | ICD-10-CM | POA: Diagnosis not present

## 2023-08-23 DIAGNOSIS — M1712 Unilateral primary osteoarthritis, left knee: Secondary | ICD-10-CM | POA: Diagnosis not present

## 2023-08-23 NOTE — Progress Notes (Signed)
   08/23/2023  Patient ID: Ashley Brennan, female   DOB: December 11, 1945, 78 y.o.   MRN: [id number]  Patient appeared on insurance report for not passing the quality metrics in 2024: Medication Adherence for Hypertension [facility])   Outreach to the patient was Successful.   Called and spoke with the patient on the phone today regarding Losartan. Confirmed last bottle was from [facility] on 04/04/23 for 90DS. Reports taking as prescribed daily and unknown how she would have enough to last until now. Advised she will need some soon and not known why it didn't come with the Atorvastatin. Called [facility] and submitted the refill for Losartan, which should arrive in 5 business days.    Delvin File, PharmD [facility]  Phone Number: [phone]

## 2023-09-04 DIAGNOSIS — Z0100 Encounter for examination of eyes and vision without abnormal findings: Secondary | ICD-10-CM | POA: Diagnosis not present

## 2023-09-11 DIAGNOSIS — M1712 Unilateral primary osteoarthritis, left knee: Secondary | ICD-10-CM | POA: Diagnosis not present

## 2023-09-18 DIAGNOSIS — M1712 Unilateral primary osteoarthritis, left knee: Secondary | ICD-10-CM | POA: Diagnosis not present

## 2023-09-25 DIAGNOSIS — M1712 Unilateral primary osteoarthritis, left knee: Secondary | ICD-10-CM | POA: Diagnosis not present

## 2023-10-11 DIAGNOSIS — M9903 Segmental and somatic dysfunction of lumbar region: Secondary | ICD-10-CM | POA: Diagnosis not present

## 2023-10-11 DIAGNOSIS — J208 Acute bronchitis due to other specified organisms: Secondary | ICD-10-CM | POA: Diagnosis not present

## 2023-10-11 DIAGNOSIS — M9905 Segmental and somatic dysfunction of pelvic region: Secondary | ICD-10-CM | POA: Diagnosis not present

## 2023-10-11 DIAGNOSIS — M9904 Segmental and somatic dysfunction of sacral region: Secondary | ICD-10-CM | POA: Diagnosis not present

## 2023-10-11 DIAGNOSIS — M51361 Other intervertebral disc degeneration, lumbar region with lower extremity pain only: Secondary | ICD-10-CM | POA: Diagnosis not present

## 2023-10-15 DIAGNOSIS — M9903 Segmental and somatic dysfunction of lumbar region: Secondary | ICD-10-CM | POA: Diagnosis not present

## 2023-10-15 DIAGNOSIS — M9905 Segmental and somatic dysfunction of pelvic region: Secondary | ICD-10-CM | POA: Diagnosis not present

## 2023-10-15 DIAGNOSIS — M51361 Other intervertebral disc degeneration, lumbar region with lower extremity pain only: Secondary | ICD-10-CM | POA: Diagnosis not present

## 2023-10-15 DIAGNOSIS — M9904 Segmental and somatic dysfunction of sacral region: Secondary | ICD-10-CM | POA: Diagnosis not present

## 2023-10-22 DIAGNOSIS — H00015 Hordeolum externum left lower eyelid: Secondary | ICD-10-CM | POA: Diagnosis not present

## 2023-10-24 ENCOUNTER — Other Ambulatory Visit: Payer: Self-pay | Admitting: Gastroenterology

## 2023-11-05 DIAGNOSIS — H0014 Chalazion left upper eyelid: Secondary | ICD-10-CM | POA: Diagnosis not present

## 2023-11-05 DIAGNOSIS — H0015 Chalazion left lower eyelid: Secondary | ICD-10-CM | POA: Diagnosis not present

## 2023-11-06 DIAGNOSIS — M51361 Other intervertebral disc degeneration, lumbar region with lower extremity pain only: Secondary | ICD-10-CM | POA: Diagnosis not present

## 2023-11-06 DIAGNOSIS — M9905 Segmental and somatic dysfunction of pelvic region: Secondary | ICD-10-CM | POA: Diagnosis not present

## 2023-11-06 DIAGNOSIS — M9904 Segmental and somatic dysfunction of sacral region: Secondary | ICD-10-CM | POA: Diagnosis not present

## 2023-11-06 DIAGNOSIS — M9903 Segmental and somatic dysfunction of lumbar region: Secondary | ICD-10-CM | POA: Diagnosis not present

## 2023-11-08 DIAGNOSIS — S83204A Other tear of unspecified meniscus, current injury, left knee, initial encounter: Secondary | ICD-10-CM | POA: Diagnosis not present

## 2023-11-08 DIAGNOSIS — M1712 Unilateral primary osteoarthritis, left knee: Secondary | ICD-10-CM | POA: Diagnosis not present

## 2023-11-12 DIAGNOSIS — H0014 Chalazion left upper eyelid: Secondary | ICD-10-CM | POA: Diagnosis not present

## 2023-11-12 DIAGNOSIS — H0015 Chalazion left lower eyelid: Secondary | ICD-10-CM | POA: Diagnosis not present

## 2023-11-13 DIAGNOSIS — Z01818 Encounter for other preprocedural examination: Secondary | ICD-10-CM | POA: Diagnosis not present

## 2023-11-15 DIAGNOSIS — M25562 Pain in left knee: Secondary | ICD-10-CM | POA: Diagnosis not present

## 2023-11-15 DIAGNOSIS — M1712 Unilateral primary osteoarthritis, left knee: Secondary | ICD-10-CM | POA: Diagnosis not present

## 2023-11-26 DIAGNOSIS — M1811 Unilateral primary osteoarthritis of first carpometacarpal joint, right hand: Secondary | ICD-10-CM | POA: Diagnosis not present

## 2023-11-26 DIAGNOSIS — M65332 Trigger finger, left middle finger: Secondary | ICD-10-CM | POA: Diagnosis not present

## 2023-11-26 DIAGNOSIS — Z4789 Encounter for other orthopedic aftercare: Secondary | ICD-10-CM | POA: Diagnosis not present

## 2023-11-28 DIAGNOSIS — G8918 Other acute postprocedural pain: Secondary | ICD-10-CM | POA: Diagnosis not present

## 2023-11-28 DIAGNOSIS — M1712 Unilateral primary osteoarthritis, left knee: Secondary | ICD-10-CM | POA: Diagnosis not present

## 2023-11-28 DIAGNOSIS — M25762 Osteophyte, left knee: Secondary | ICD-10-CM | POA: Diagnosis not present

## 2023-12-03 DIAGNOSIS — M25562 Pain in left knee: Secondary | ICD-10-CM | POA: Diagnosis not present

## 2023-12-05 DIAGNOSIS — M25562 Pain in left knee: Secondary | ICD-10-CM | POA: Diagnosis not present

## 2023-12-10 DIAGNOSIS — M25562 Pain in left knee: Secondary | ICD-10-CM | POA: Diagnosis not present

## 2023-12-14 DIAGNOSIS — M25562 Pain in left knee: Secondary | ICD-10-CM | POA: Diagnosis not present

## 2023-12-17 DIAGNOSIS — M25562 Pain in left knee: Secondary | ICD-10-CM | POA: Diagnosis not present

## 2023-12-20 DIAGNOSIS — M25562 Pain in left knee: Secondary | ICD-10-CM | POA: Diagnosis not present

## 2023-12-24 DIAGNOSIS — M25562 Pain in left knee: Secondary | ICD-10-CM | POA: Diagnosis not present

## 2023-12-25 DIAGNOSIS — M5416 Radiculopathy, lumbar region: Secondary | ICD-10-CM | POA: Diagnosis not present

## 2024-01-01 DIAGNOSIS — M25562 Pain in left knee: Secondary | ICD-10-CM | POA: Diagnosis not present

## 2024-01-02 DIAGNOSIS — M9903 Segmental and somatic dysfunction of lumbar region: Secondary | ICD-10-CM | POA: Diagnosis not present

## 2024-01-02 DIAGNOSIS — M9904 Segmental and somatic dysfunction of sacral region: Secondary | ICD-10-CM | POA: Diagnosis not present

## 2024-01-02 DIAGNOSIS — M9905 Segmental and somatic dysfunction of pelvic region: Secondary | ICD-10-CM | POA: Diagnosis not present

## 2024-01-02 DIAGNOSIS — M51361 Other intervertebral disc degeneration, lumbar region with lower extremity pain only: Secondary | ICD-10-CM | POA: Diagnosis not present

## 2024-01-04 ENCOUNTER — Encounter: Payer: Self-pay | Admitting: Gastroenterology

## 2024-01-04 DIAGNOSIS — M25562 Pain in left knee: Secondary | ICD-10-CM | POA: Diagnosis not present

## 2024-01-08 DIAGNOSIS — M9903 Segmental and somatic dysfunction of lumbar region: Secondary | ICD-10-CM | POA: Diagnosis not present

## 2024-01-08 DIAGNOSIS — M9904 Segmental and somatic dysfunction of sacral region: Secondary | ICD-10-CM | POA: Diagnosis not present

## 2024-01-08 DIAGNOSIS — M51361 Other intervertebral disc degeneration, lumbar region with lower extremity pain only: Secondary | ICD-10-CM | POA: Diagnosis not present

## 2024-01-08 DIAGNOSIS — M9905 Segmental and somatic dysfunction of pelvic region: Secondary | ICD-10-CM | POA: Diagnosis not present

## 2024-01-11 DIAGNOSIS — M25562 Pain in left knee: Secondary | ICD-10-CM | POA: Diagnosis not present

## 2024-01-14 DIAGNOSIS — D0462 Carcinoma in situ of skin of left upper limb, including shoulder: Secondary | ICD-10-CM | POA: Diagnosis not present

## 2024-01-14 DIAGNOSIS — D485 Neoplasm of uncertain behavior of skin: Secondary | ICD-10-CM | POA: Diagnosis not present

## 2024-01-14 DIAGNOSIS — L821 Other seborrheic keratosis: Secondary | ICD-10-CM | POA: Diagnosis not present

## 2024-01-14 DIAGNOSIS — L814 Other melanin hyperpigmentation: Secondary | ICD-10-CM | POA: Diagnosis not present

## 2024-01-14 DIAGNOSIS — Z85828 Personal history of other malignant neoplasm of skin: Secondary | ICD-10-CM | POA: Diagnosis not present

## 2024-01-14 DIAGNOSIS — L570 Actinic keratosis: Secondary | ICD-10-CM | POA: Diagnosis not present

## 2024-01-14 DIAGNOSIS — D2371 Other benign neoplasm of skin of right lower limb, including hip: Secondary | ICD-10-CM | POA: Diagnosis not present

## 2024-01-16 DIAGNOSIS — M9905 Segmental and somatic dysfunction of pelvic region: Secondary | ICD-10-CM | POA: Diagnosis not present

## 2024-01-16 DIAGNOSIS — M51361 Other intervertebral disc degeneration, lumbar region with lower extremity pain only: Secondary | ICD-10-CM | POA: Diagnosis not present

## 2024-01-16 DIAGNOSIS — M9903 Segmental and somatic dysfunction of lumbar region: Secondary | ICD-10-CM | POA: Diagnosis not present

## 2024-01-16 DIAGNOSIS — M9904 Segmental and somatic dysfunction of sacral region: Secondary | ICD-10-CM | POA: Diagnosis not present

## 2024-01-18 DIAGNOSIS — M25562 Pain in left knee: Secondary | ICD-10-CM | POA: Diagnosis not present

## 2024-01-22 DIAGNOSIS — M25562 Pain in left knee: Secondary | ICD-10-CM | POA: Diagnosis not present

## 2024-01-25 DIAGNOSIS — M25562 Pain in left knee: Secondary | ICD-10-CM | POA: Diagnosis not present

## 2024-01-28 DIAGNOSIS — M25562 Pain in left knee: Secondary | ICD-10-CM | POA: Diagnosis not present

## 2024-01-30 DIAGNOSIS — M9904 Segmental and somatic dysfunction of sacral region: Secondary | ICD-10-CM | POA: Diagnosis not present

## 2024-01-30 DIAGNOSIS — M51361 Other intervertebral disc degeneration, lumbar region with lower extremity pain only: Secondary | ICD-10-CM | POA: Diagnosis not present

## 2024-01-30 DIAGNOSIS — M9903 Segmental and somatic dysfunction of lumbar region: Secondary | ICD-10-CM | POA: Diagnosis not present

## 2024-01-30 DIAGNOSIS — M9905 Segmental and somatic dysfunction of pelvic region: Secondary | ICD-10-CM | POA: Diagnosis not present

## 2024-01-31 DIAGNOSIS — M25562 Pain in left knee: Secondary | ICD-10-CM | POA: Diagnosis not present

## 2024-02-06 DIAGNOSIS — I10 Essential (primary) hypertension: Secondary | ICD-10-CM | POA: Diagnosis not present

## 2024-02-06 DIAGNOSIS — Z79899 Other long term (current) drug therapy: Secondary | ICD-10-CM | POA: Diagnosis not present

## 2024-02-11 DIAGNOSIS — M25562 Pain in left knee: Secondary | ICD-10-CM | POA: Diagnosis not present

## 2024-02-12 DIAGNOSIS — M4807 Spinal stenosis, lumbosacral region: Secondary | ICD-10-CM | POA: Diagnosis not present

## 2024-02-12 DIAGNOSIS — M5116 Intervertebral disc disorders with radiculopathy, lumbar region: Secondary | ICD-10-CM | POA: Diagnosis not present

## 2024-02-12 DIAGNOSIS — M5137 Other intervertebral disc degeneration, lumbosacral region with discogenic back pain only: Secondary | ICD-10-CM | POA: Diagnosis not present

## 2024-02-12 DIAGNOSIS — M5416 Radiculopathy, lumbar region: Secondary | ICD-10-CM | POA: Diagnosis not present

## 2024-02-12 DIAGNOSIS — M48061 Spinal stenosis, lumbar region without neurogenic claudication: Secondary | ICD-10-CM | POA: Diagnosis not present

## 2024-03-04 DIAGNOSIS — M25562 Pain in left knee: Secondary | ICD-10-CM | POA: Diagnosis not present

## 2024-03-05 DIAGNOSIS — M5416 Radiculopathy, lumbar region: Secondary | ICD-10-CM | POA: Diagnosis not present

## 2024-04-22 ENCOUNTER — Ambulatory Visit: Admitting: Gastroenterology

## 2024-04-22 ENCOUNTER — Encounter: Payer: Self-pay | Admitting: Gastroenterology

## 2024-04-22 VITALS — BP 130/80 | HR 80 | Ht 61.0 in | Wt 135.0 lb

## 2024-04-22 DIAGNOSIS — K449 Diaphragmatic hernia without obstruction or gangrene: Secondary | ICD-10-CM

## 2024-04-22 DIAGNOSIS — K219 Gastro-esophageal reflux disease without esophagitis: Secondary | ICD-10-CM

## 2024-04-22 DIAGNOSIS — K648 Other hemorrhoids: Secondary | ICD-10-CM | POA: Diagnosis not present

## 2024-04-22 DIAGNOSIS — K760 Fatty (change of) liver, not elsewhere classified: Secondary | ICD-10-CM

## 2024-04-22 DIAGNOSIS — K580 Irritable bowel syndrome with diarrhea: Secondary | ICD-10-CM

## 2024-04-22 MED ORDER — HYDROCORTISONE ACETATE 25 MG RE SUPP: 25.0000 mg | Freq: Every day | RECTAL | 0 refills | Status: AC | PRN

## 2024-04-22 NOTE — Patient Instructions (Addendum)
" °  Diarrhea - Continue Cholestyramine  4g po BID - Continue dicyclomine  20mg  po TID prn  -call office if symptoms don't improve    Fatty liver -Pamphlet attached  [email or web address]  [email or web address]  GERD -Continue famotidine   -Recommend GERD diet  -try reflux gourmet over the counter  _______________________________________________________  If your blood pressure at your visit was 140/90 or greater, please contact your primary care physician to follow up on this.  _______________________________________________________  If you are age 12 or older, your body mass index should be between 23-30. Your Body mass index is 25.51 kg/m. If this is out of the aforementioned range listed, please consider follow up with your Primary Care Provider.  If you are age 4 or younger, your body mass index should be between 19-25. Your Body mass index is 25.51 kg/m. If this is out of the aformentioned range listed, please consider follow up with your Primary Care Provider.   ________________________________________________________  The  GI providers would like to encourage you to use MYCHART to communicate with providers for non-urgent requests or questions.  Due to long hold times on the telephone, sending your provider a message by [facility] may be a faster and more efficient way to get a response.  Please allow 48 business hours for a response.  Please remember that this is for non-urgent requests.  _______________________________________________________  [facility] is using a team-based approach to care.  Your team is made up of your doctor and two to three APPS. Our APPS (Nurse Practitioners and Physician Assistants) work with your physician to ensure care continuity for you. They  are fully qualified to address your health concerns and develop a treatment plan. They communicate directly with your gastroenterologist to care for you. Seeing the Advanced Practice Practitioners on your physician's team can help you by facilitating care more promptly, often allowing for earlier appointments, access to diagnostic testing, procedures, and other specialty referrals.   Thank you for trusting me with your gastrointestinal care. Deanna May, FNP-C   "
